# Patient Record
Sex: Female | Born: 1940 | Race: White | Hispanic: No | Smoking: Current every day smoker
Health system: Southern US, Community
[De-identification: ages and names within clinical notes are randomized; demographics above are authoritative.]

## PROBLEM LIST (undated history)

## (undated) DIAGNOSIS — D0510 Intraductal carcinoma in situ of unspecified breast: Secondary | ICD-10-CM

## (undated) DIAGNOSIS — I10 Essential (primary) hypertension: Secondary | ICD-10-CM

## (undated) DIAGNOSIS — M810 Age-related osteoporosis without current pathological fracture: Secondary | ICD-10-CM

## (undated) DIAGNOSIS — M1990 Unspecified osteoarthritis, unspecified site: Secondary | ICD-10-CM

## (undated) DIAGNOSIS — M419 Scoliosis, unspecified: Secondary | ICD-10-CM

## (undated) DIAGNOSIS — R42 Dizziness and giddiness: Secondary | ICD-10-CM

## (undated) HISTORY — DX: Age-related osteoporosis without current pathological fracture: M81.0

## (undated) HISTORY — DX: Intraductal carcinoma in situ of unspecified breast: D05.10

## (undated) HISTORY — DX: Scoliosis, unspecified: M41.9

## (undated) HISTORY — PX: OTHER SURGICAL HISTORY: SHX169

## (undated) HISTORY — PX: THYROIDECTOMY: SHX17

---

## 2004-02-25 ENCOUNTER — Ambulatory Visit (HOSPITAL_COMMUNITY): Admission: RE | Admit: 2004-02-25 | Discharge: 2004-02-25 | Payer: Self-pay | Admitting: Family Medicine

## 2009-08-20 ENCOUNTER — Emergency Department (HOSPITAL_COMMUNITY): Admission: EM | Admit: 2009-08-20 | Discharge: 2009-08-20 | Payer: Self-pay | Admitting: Emergency Medicine

## 2014-02-14 ENCOUNTER — Emergency Department (HOSPITAL_COMMUNITY)
Admission: EM | Admit: 2014-02-14 | Discharge: 2014-02-14 | Disposition: A | Discharge status: Home / Self Care | Payer: Medicare Other | Attending: Emergency Medicine | Admitting: Emergency Medicine

## 2014-02-14 ENCOUNTER — Emergency Department (HOSPITAL_COMMUNITY): Payer: Medicare Other

## 2014-02-14 ENCOUNTER — Encounter (HOSPITAL_COMMUNITY): Payer: Self-pay | Admitting: Emergency Medicine

## 2014-02-14 DIAGNOSIS — M25569 Pain in unspecified knee: Secondary | ICD-10-CM | POA: Insufficient documentation

## 2014-02-14 DIAGNOSIS — I10 Essential (primary) hypertension: Secondary | ICD-10-CM | POA: Insufficient documentation

## 2014-02-14 DIAGNOSIS — M545 Low back pain, unspecified: Secondary | ICD-10-CM | POA: Insufficient documentation

## 2014-02-14 HISTORY — DX: Unspecified osteoarthritis, unspecified site: M19.90

## 2014-02-14 LAB — URINALYSIS, ROUTINE W REFLEX MICROSCOPIC
Bilirubin Urine: NEGATIVE
Glucose, UA: NEGATIVE mg/dL
LEUKOCYTES UA: NEGATIVE
NITRITE: NEGATIVE
PROTEIN: 30 mg/dL — AB
Urobilinogen, UA: 0.2 mg/dL (ref 0.0–1.0)
pH: 5.5 (ref 5.0–8.0)

## 2014-02-14 LAB — URINE MICROSCOPIC-ADD ON

## 2014-02-14 MED ORDER — OXYCODONE-ACETAMINOPHEN 5-325 MG PO TABS: 1.0000 | ORAL_TABLET | Freq: Four times a day (QID) | ORAL | Status: DC | PRN

## 2014-02-14 MED ORDER — OXYCODONE-ACETAMINOPHEN 5-325 MG PO TABS
1.0000 | ORAL_TABLET | Freq: Once | ORAL | Status: AC
  Administered 2014-02-14: 1 via ORAL
  Filled 2014-02-14: qty 1

## 2014-02-14 MED ORDER — CYCLOBENZAPRINE HCL 10 MG PO TABS: 10.0000 mg | ORAL_TABLET | Freq: Two times a day (BID) | ORAL | Status: DC | PRN

## 2014-02-14 NOTE — Discharge Instructions (Signed)
SEEK IMMEDIATE MEDICAL ATTENTION IF: New numbness, tingling, weakness, or problem with the use of your arms or legs.  Severe back pain not relieved with medications.  Change in bowel or bladder control (if you lose control of stool or urine, or if you are unable to urinate) Increasing pain in any areas of the body (such as chest or abdominal pain).  Shortness of breath, dizziness or fainting.  Nausea (feeling sick to your stomach), vomiting, fever, or sweats.    Hypertension Hypertension, commonly called high blood pressure, is when the force of blood pumping through your arteries is too strong. Your arteries are the blood vessels that carry blood from your heart throughout your body. A blood pressure reading consists of a higher number over a lower number, such as 110/72. The higher number (systolic) is the pressure inside your arteries when your heart pumps. The lower number (diastolic) is the pressure inside your arteries when your heart relaxes. Ideally you want your blood pressure below 120/80. Hypertension forces your heart to work harder to pump blood. Your arteries may become narrow or stiff. Having hypertension puts you at risk for heart disease, stroke, and other problems.  RISK FACTORS Some risk factors for high blood pressure are controllable. Others are not.  Risk factors you cannot control include:   Race. You may be at higher risk if you are African American.  Age. Risk increases with age.  Gender. Men are at higher risk than women before age 52 years. After age 37, women are at higher risk than men. Risk factors you can control include:  Not getting enough exercise or physical activity.  Being overweight.  Getting too much fat, sugar, calories, or salt in your diet.  Drinking too much alcohol. SIGNS AND SYMPTOMS Hypertension does not usually cause signs or symptoms. Extremely high blood pressure (hypertensive crisis) may cause headache, anxiety, shortness of breath,  and nosebleed. DIAGNOSIS  To check if you have hypertension, your health care provider will measure your blood pressure while you are seated, with your arm held at the level of your heart. It should be measured at least twice using the same arm. Certain conditions can cause a difference in blood pressure between your right and left arms. A blood pressure reading that is higher than normal on one occasion does not mean that you need treatment. If one blood pressure reading is high, ask your health care provider about having it checked again. TREATMENT  Treating high blood pressure includes making lifestyle changes and possibly taking medication. Living a healthy lifestyle can help lower high blood pressure. You may need to change some of your habits. Lifestyle changes may include:  Following the DASH diet. This diet is high in fruits, vegetables, and whole grains. It is low in salt, red meat, and added sugars.  Getting at least 2 1/2 hours of brisk physical activity every week.  Losing weight if necessary.  Not smoking.  Limiting alcoholic beverages.  Learning ways to reduce stress. If lifestyle changes are not enough to get your blood pressure under control, your health care provider may prescribe medicine. You may need to take more than one. Work closely with your health care provider to understand the risks and benefits. HOME CARE INSTRUCTIONS  Have your blood pressure rechecked as directed by your health care provider.   Only take medicine as directed by your health care provider. Follow the directions carefully. Blood pressure medicines must be taken as prescribed. The medicine does not work as  well when you skip doses. Skipping doses also puts you at risk for problems.   Do not smoke.   Monitor your blood pressure at home as directed by your health care provider. SEEK MEDICAL CARE IF:   You think you are having a reaction to medicines taken.  You have recurrent headaches or  feel dizzy.  You have swelling in your ankles.  You have trouble with your vision. SEEK IMMEDIATE MEDICAL CARE IF:  You develop a severe headache or confusion.  You have unusual weakness, numbness, or feel faint.  You have severe chest or abdominal pain.  You vomit repeatedly.  You have trouble breathing. MAKE SURE YOU:   Understand these instructions.  Will watch your condition.  Will get help right away if you are not doing well or get worse. Document Released: 08/02/2005 Document Revised: 08/07/2013 Document Reviewed: 05/25/2013 [facility] Patient Information 2015 [place], [place]. This information is not intended to replace advice given to you by your health care provider. Make sure you discuss any questions you have with your health care provider.

## 2014-02-14 NOTE — ED Notes (Signed)
Patient with no complaints at this time. Respirations even and unlabored. Skin warm/dry. Discharge instructions reviewed with patient at this time. Patient given opportunity to voice concerns/ask questions. Patient discharged at this time and left Emergency Department with steady gait.   

## 2014-02-14 NOTE — ED Notes (Signed)
MD in room talking with daughter at this time.

## 2014-02-14 NOTE — ED Notes (Signed)
Patient c/o lower back pain and right knee pain. Per patient picked up "something heavy" Monday and back and knee pain started Tuesday. Denies any other injury or difficulty with urination or BMs.

## 2014-02-14 NOTE — ED Provider Notes (Signed)
CSN: [id number]     Arrival date & time 02/14/14  3875 History  This chart was scribed for Joya Gaskins, MD by Leone Payor, ED Scribe. This patient was seen in room APA03/APA03 and the patient's care was started 7:42 AM.    Chief Complaint  Patient presents with  . Back Pain  . Knee Pain    Patient is a 73 y.o. female presenting with back pain and knee pain. The history is provided by the patient. No language interpreter was used.  Back Pain Location:  Lumbar spine Radiates to:  Does not radiate Pain severity:  Mild Pain is:  Same all the time Onset quality:  Gradual Duration:  2 days Timing:  Constant Progression:  Worsening Chronicity:  New Context: lifting heavy objects   Context: not falling   Worsened by:  Ambulation Ineffective treatments:  NSAIDs Associated symptoms: no abdominal pain, no bladder incontinence, no bowel incontinence, no dysuria, no fever, no leg pain, no numbness, no paresthesias, no tingling and no weakness   Risk factors: no recent surgery   Knee Pain Associated symptoms: back pain   Associated symptoms: no fever     HPI Comments: Maria Hardy is a 73 y.o. female who presents to the Emergency Department complaining of gradual onset, constant, gradually worsened, non-radiating lower back pain that began 2 days ago. Patient states she lifted a heavy object 3 days ago but states the pain did not begin until the following day. She also reports right knee pain but she believes this to be related to arthritis. She denies any recent falls or trauma. She has taken ibuprofen without relief. She denies fever, vomiting, chest pain, abdominal pain, dysuria, lower extremity weakness, change in bowel of bladder function. She denies history of back surgeries.   PMH - none No past surgical history on file. No family history on file. History  Substance Use Topics  . Smoking status: Not on file  . Smokeless tobacco: Not on file  . Alcohol Use: Not on file   OB  History   No data available     Review of Systems  Constitutional: Negative for fever.  Gastrointestinal: Negative for vomiting, abdominal pain and bowel incontinence.  Genitourinary: Negative for bladder incontinence and dysuria.  Musculoskeletal: Positive for arthralgias (right knee pain) and back pain.  Neurological: Negative for tingling, weakness, numbness and paresthesias.  All other systems reviewed and are negative.     Allergies  Review of patient's allergies indicates not on file.  Home Medications   Prior to Admission medications   Not on File   BP 189/102  Pulse 70  Temp(Src) 98.5 F (36.9 C) (Oral)  Resp 18  Ht 5\' 7"  (1.702 m)  Wt 170 lb (77.111 kg)  BMI 26.62 kg/m2  SpO2 98% Physical Exam  Nursing note and vitals reviewed.   CONSTITUTIONAL: Well developed/well nourished HEAD: Normocephalic/atraumatic EYES: EOMI ENMT: Mucous membranes moist NECK: supple no meningeal signs SPINE:entire spine nontender lumbar paraspinal tenderness, No bruising/crepitance/stepoffs noted to spine CV: S1/S2 noted, no murmurs/rubs/gallops noted LUNGS: Lungs are clear to auscultation bilaterally, no apparent distress ABDOMEN: soft, nontender, no rebound or guarding GU:no cva tenderness NEURO: Awake/alert, equal motor 5/5 strength noted with the following: hip flexion/knee flexion/extension, foot dorsi/plantar flexion, great toe extension intact bilaterally, no clonus bilaterally, plantar reflex appropriate (toes downgoing), no sensory deficit in any dermatome.  Equal patellar/achilles reflex noted (2+) in bilateral lower extremities.  Pt is able to ambulate unassisted. EXTREMITIES: pulses normal, full  ROM, mild tenderness to medial aspect of right knee, no bruising or deformity  SKIN: warm, color normal PSYCH: no abnormalities of mood noted   ED Course  Procedures   DIAGNOSTIC STUDIES: Oxygen Saturation is 98% on RA, normal by my interpretation.    COORDINATION OF  CARE: 7:46 AM Will order XRAY of lumbar spine and UA. Discussed treatment plan with pt at bedside and pt agreed to plan.  8:37 AM Daughter in law tells me in private that since the loss of her husband in the spring, patient cries at night and does not sleep.  She feels she needs "something for sleep" She is not a safety threat and does not feel she will harm herself.  I advised f/u with her PCP for further management  Pt improved Advised PCP followup Stable for d/c home   Labs Review Labs Reviewed  URINALYSIS, ROUTINE W REFLEX MICROSCOPIC - Abnormal; Notable for the following:    Specific Gravity, Urine >1.030 (*)    Hgb urine dipstick TRACE (*)    Ketones, ur TRACE (*)    Protein, ur 30 (*)    All other components within normal limits  URINE MICROSCOPIC-ADD ON - Abnormal; Notable for the following:    Squamous Epithelial / LPF MANY (*)    Bacteria, UA MANY (*)    All other components within normal limits  URINE CULTURE    Imaging Review Dg Lumbar Spine Complete  02/14/2014   CLINICAL DATA:  Low back pain.  Lifting injury on Monday.  EXAM: LUMBAR SPINE - COMPLETE 4+ VIEW  COMPARISON:  None.  FINDINGS: There is no evidence of lumbar spine fracture. There is grade 1 anterior listhesis of L5 on S1, degenerative. Minimal anterior osteophytosis distended throughout lumbar spine.  IMPRESSION: No acute fracture or dislocation. Degenerative joint changes of lumbar spine.   Electronically Signed   By: Sherian ReinWei-Chen  Lin M.D.   On: 02/14/2014 08:20      MDM   Final diagnoses:  Low back pain without sciatica, unspecified back pain laterality  Essential hypertension   Nursing notes including past medical history and social history reviewed and considered in documentation xrays reviewed and considered   I personally performed the services described in this documentation, which was scribed in my presence. The recorded information has been reviewed and is accurate.      Joya Gaskinsonald W Rajah Tagliaferro,  MD 02/14/14 [phone]

## 2014-02-15 LAB — URINE CULTURE: Colony Count: 30000

## 2014-06-17 ENCOUNTER — Encounter (HOSPITAL_COMMUNITY): Payer: Self-pay | Admitting: Emergency Medicine

## 2014-12-27 ENCOUNTER — Emergency Department (HOSPITAL_COMMUNITY): Payer: Medicare HMO

## 2014-12-27 ENCOUNTER — Encounter (HOSPITAL_COMMUNITY): Payer: Self-pay | Admitting: Emergency Medicine

## 2014-12-27 ENCOUNTER — Emergency Department (HOSPITAL_COMMUNITY)
Admission: EM | Admit: 2014-12-27 | Discharge: 2014-12-27 | Disposition: A | Discharge status: Home / Self Care | Payer: Medicare HMO | Attending: Emergency Medicine | Admitting: Emergency Medicine

## 2014-12-27 DIAGNOSIS — R42 Dizziness and giddiness: Secondary | ICD-10-CM

## 2014-12-27 DIAGNOSIS — Z720 Tobacco use: Secondary | ICD-10-CM | POA: Diagnosis not present

## 2014-12-27 DIAGNOSIS — M1990 Unspecified osteoarthritis, unspecified site: Secondary | ICD-10-CM | POA: Diagnosis not present

## 2014-12-27 DIAGNOSIS — I10 Essential (primary) hypertension: Secondary | ICD-10-CM | POA: Diagnosis not present

## 2014-12-27 DIAGNOSIS — R112 Nausea with vomiting, unspecified: Secondary | ICD-10-CM | POA: Insufficient documentation

## 2014-12-27 LAB — I-STAT CHEM 8, ED
BUN: 21 mg/dL — AB (ref 6–20)
CHLORIDE: 104 mmol/L (ref 101–111)
Calcium, Ion: 1.18 mmol/L (ref 1.13–1.30)
Creatinine, Ser: 0.7 mg/dL (ref 0.44–1.00)
GLUCOSE: 106 mg/dL — AB (ref 65–99)
HCT: 47 % — ABNORMAL HIGH (ref 36.0–46.0)
Hemoglobin: 16 g/dL — ABNORMAL HIGH (ref 12.0–15.0)
POTASSIUM: 4 mmol/L (ref 3.5–5.1)
SODIUM: 141 mmol/L (ref 135–145)
TCO2: 22 mmol/L (ref 0–100)

## 2014-12-27 LAB — CBC WITH DIFFERENTIAL/PLATELET
BASOS ABS: 0 10*3/uL (ref 0.0–0.1)
BASOS PCT: 0 % (ref 0–1)
Eosinophils Absolute: 0 10*3/uL (ref 0.0–0.7)
Eosinophils Relative: 1 % (ref 0–5)
HEMATOCRIT: 45.6 % (ref 36.0–46.0)
HEMOGLOBIN: 15.4 g/dL — AB (ref 12.0–15.0)
LYMPHS PCT: 20 % (ref 12–46)
Lymphs Abs: 1.7 10*3/uL (ref 0.7–4.0)
MCH: 31.2 pg (ref 26.0–34.0)
MCHC: 33.8 g/dL (ref 30.0–36.0)
MCV: 92.5 fL (ref 78.0–100.0)
MONO ABS: 0.4 10*3/uL (ref 0.1–1.0)
MONOS PCT: 4 % (ref 3–12)
NEUTROS ABS: 6.5 10*3/uL (ref 1.7–7.7)
NEUTROS PCT: 75 % (ref 43–77)
Platelets: 265 10*3/uL (ref 150–400)
RBC: 4.93 MIL/uL (ref 3.87–5.11)
RDW: 12.4 % (ref 11.5–15.5)
WBC: 8.6 10*3/uL (ref 4.0–10.5)

## 2014-12-27 LAB — COMPREHENSIVE METABOLIC PANEL
ALT: 12 U/L — ABNORMAL LOW (ref 14–54)
ANION GAP: 9 (ref 5–15)
AST: 20 U/L (ref 15–41)
Albumin: 4.6 g/dL (ref 3.5–5.0)
Alkaline Phosphatase: 93 U/L (ref 38–126)
BILIRUBIN TOTAL: 0.9 mg/dL (ref 0.3–1.2)
BUN: 21 mg/dL — AB (ref 6–20)
CALCIUM: 9.2 mg/dL (ref 8.9–10.3)
CHLORIDE: 104 mmol/L (ref 101–111)
CO2: 25 mmol/L (ref 22–32)
Creatinine, Ser: 0.72 mg/dL (ref 0.44–1.00)
GFR calc Af Amer: >60 mL/min (ref >60)
GFR calc non Af Amer: >60 mL/min (ref >60)
GLUCOSE: 108 mg/dL — AB (ref 65–99)
Potassium: 3.9 mmol/L (ref 3.5–5.1)
Sodium: 138 mmol/L (ref 135–145)
TOTAL PROTEIN: 7.7 g/dL (ref 6.5–8.1)

## 2014-12-27 LAB — URINALYSIS, ROUTINE W REFLEX MICROSCOPIC
Bilirubin Urine: NEGATIVE
Glucose, UA: NEGATIVE mg/dL
KETONES UR: 15 mg/dL — AB
NITRITE: NEGATIVE
PROTEIN: NEGATIVE mg/dL
Specific Gravity, Urine: 1.025 (ref 1.005–1.030)
UROBILINOGEN UA: 0.2 mg/dL (ref 0.0–1.0)
pH: 6 (ref 5.0–8.0)

## 2014-12-27 LAB — RAPID URINE DRUG SCREEN, HOSP PERFORMED
Amphetamines: NOT DETECTED
BARBITURATES: NOT DETECTED
BENZODIAZEPINES: NOT DETECTED
COCAINE: NOT DETECTED
OPIATES: NOT DETECTED
TETRAHYDROCANNABINOL: NOT DETECTED

## 2014-12-27 LAB — APTT: aPTT: 35 seconds (ref 24–37)

## 2014-12-27 LAB — URINE MICROSCOPIC-ADD ON

## 2014-12-27 LAB — I-STAT TROPONIN, ED: TROPONIN I, POC: 0 ng/mL (ref 0.00–0.08)

## 2014-12-27 LAB — PROTIME-INR
INR: 0.99 (ref 0.00–1.49)
PROTHROMBIN TIME: 13.2 s (ref 11.6–15.2)

## 2014-12-27 LAB — ETHANOL: Alcohol, Ethyl (B): 5 mg/dL — ABNORMAL HIGH (ref <5)

## 2014-12-27 MED ORDER — MECLIZINE HCL 12.5 MG PO TABS
25.0000 mg | ORAL_TABLET | Freq: Once | ORAL | Status: AC
  Administered 2014-12-27: 25 mg via ORAL
  Filled 2014-12-27: qty 2

## 2014-12-27 MED ORDER — MECLIZINE HCL 25 MG PO TABS: 25.0000 mg | ORAL_TABLET | Freq: Four times a day (QID) | ORAL | Status: DC

## 2014-12-27 MED ORDER — ONDANSETRON HCL 4 MG/2ML IJ SOLN
4.0000 mg | Freq: Once | INTRAMUSCULAR | Status: AC
  Administered 2014-12-27: 4 mg via INTRAVENOUS
  Filled 2014-12-27: qty 2

## 2014-12-27 MED ORDER — ONDANSETRON 4 MG PO TBDP: 4.0000 mg | ORAL_TABLET | Freq: Three times a day (TID) | ORAL | Status: AC | PRN

## 2014-12-27 NOTE — ED Notes (Signed)
Pt c/o dizziness that started this am. Pt states she got dizzy and vomited x 1. Pt denies CP or SOB. No neuro deficits noted. Pt states she is okay as long as she is laying down but gets dizzy when she stands.

## 2014-12-27 NOTE — ED Notes (Signed)
Pt states she is having dizziness with x1 episode of emesis.    Pt is alert and oriented at this time with no difficulties breathing.

## 2014-12-27 NOTE — Discharge Instructions (Signed)
Benign Positional Vertigo Vertigo means you feel like you or your surroundings are moving when they are not. Benign positional vertigo is the most common form of vertigo. Benign means that the cause of your condition is not serious. Benign positional vertigo is more common in older adults. CAUSES  Benign positional vertigo is the result of an upset in the labyrinth system. This is an area in the middle ear that helps control your balance. This may be caused by a viral infection, head injury, or repetitive motion. However, often no specific cause is found. SYMPTOMS  Symptoms of benign positional vertigo occur when you move your head or eyes in different directions. Some of the symptoms may include:  Loss of balance and falls.  Vomiting.  Blurred vision.  Dizziness.  Nausea.  Involuntary eye movements (nystagmus). DIAGNOSIS  Benign positional vertigo is usually diagnosed by physical exam. If the specific cause of your benign positional vertigo is unknown, your caregiver may perform imaging tests, such as magnetic resonance imaging (MRI) or computed tomography (CT). TREATMENT  Your caregiver may recommend movements or procedures to correct the benign positional vertigo. Medicines such as meclizine, benzodiazepines, and medicines for nausea may be used to treat your symptoms. In rare cases, if your symptoms are caused by certain conditions that affect the inner ear, you may need surgery. HOME CARE INSTRUCTIONS   Follow your caregiver's instructions.  Move slowly. Do not make sudden body or head movements.  Avoid driving.  Avoid operating heavy machinery.  Avoid performing any tasks that would be dangerous to you or others during a vertigo episode.  Drink enough fluids to keep your urine clear or pale yellow. SEEK IMMEDIATE MEDICAL CARE IF:   You develop problems with walking, weakness, numbness, or using your arms, hands, or legs.  You have difficulty speaking.  You develop  severe headaches.  Your nausea or vomiting continues or gets worse.  You develop visual changes.  Your family or friends notice any behavioral changes.  Your condition gets worse.  You have a fever.  You develop a stiff neck or sensitivity to light. MAKE SURE YOU:   Understand these instructions.  Will watch your condition.  Will get help right away if you are not doing well or get worse. Document Released: 05/10/2006 Document Revised: 10/25/2011 Document Reviewed: 04/22/2011 [facility] Patient Information 2015 [place], [place]. This information is not intended to replace advice given to you by your health care provider. Make sure you discuss any questions you have with your health care provider.  MRI of the brain without any acute findings. Take and I burred as directed. Take Zofran as needed for nausea and vomiting. Make an appointment to follow-up with your doctor next week. For recheck for the vertigo and also for recheck of your blood pressure.

## 2014-12-27 NOTE — ED Provider Notes (Signed)
CSN: [id number]     Arrival date & time 12/27/14  1034 History   First MD Initiated Contact with Patient 12/27/14 1047     Chief Complaint  Patient presents with  . Dizziness   Patient is a 74 y.o. female presenting with dizziness. The history is provided by the patient. No language interpreter was used.  Dizziness Associated symptoms: nausea and vomiting   Associated symptoms: no chest pain, no headaches, no shortness of breath and no weakness    This chart was scribed for Vanetta Mulders, MD by Andrew Au, ED Scribe. This patient was seen in room APA18/APA18 and the patient's care was started at 2:36 PM.  HPI Comments:  Maria Hardy is a 74 y.o. female who present to the Emergency Department complaining of dizziness onset 3 hours ago. Pt reports room spinning dizziness this morning when trying to get out of bed. She reports associated nausea and emeis this morning with dizziness and some back pain.  She denies being dizzy at this time while lying in bed. She denies hx dizziness. She denies visual changes, changes in speech, fever,chills, CP, SOB, abdominal pain, leg swelling, rash, headache, weakness, numbness, and dysuria.  Past Medical History  Diagnosis Date  . Arthritis    Past Surgical History  Procedure Laterality Date  . Thyroidectomy     History reviewed. No pertinent family history. History  Substance Use Topics  . 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 Smokeless tobacco: Never Used  . Alcohol Use: No   OB History    Gravida Para Term Preterm AB TAB SAB Ectopic Multiple Living   3 3 3       3      Review of Systems  Constitutional: Negative for fever.  HENT: Negative for congestion.   Eyes: Negative for visual disturbance.  Respiratory: Negative for shortness of breath.   Cardiovascular: Negative for chest pain and leg swelling.  Gastrointestinal: Positive for nausea and vomiting.  Genitourinary:  Negative for dysuria.  Musculoskeletal: Negative for back pain.  Skin: Negative for rash.  Neurological: Positive for dizziness. Negative for speech difficulty, weakness, numbness and headaches.  Psychiatric/Behavioral: Negative for confusion.   Allergies  Review of patient's allergies indicates no known allergies.  Home Medications   Prior to Admission medications   Medication Sig Start Date End Date Taking? Authorizing Provider  ibuprofen (ADVIL,MOTRIN) 200 MG tablet Take 800 mg by mouth every 6 (six) hours as needed for moderate pain.   Yes Historical Provider, MD  cyclobenzaprine (FLEXERIL) 10 MG tablet Take 1 tablet (10 mg total) by mouth 2 (two) times daily as needed for muscle spasms. Patient not taking: Reported on 12/27/2014 02/14/14   Zadie Rhine, MD  meclizine (ANTIVERT) 25 MG tablet Take 1 tablet (25 mg total) by mouth 4 (four) times daily. 12/27/14   Vanetta Mulders, MD  ondansetron (ZOFRAN ODT) 4 MG disintegrating tablet Take 1 tablet (4 mg total) by mouth every 8 (eight) hours as needed. 12/27/14   Vanetta Mulders, MD  oxyCODONE-acetaminophen (PERCOCET/ROXICET) 5-325 MG per tablet Take 1 tablet by mouth every 6 (six) hours as needed for severe pain. Patient not taking: Reported on 12/27/2014 02/14/14   Zadie Rhine, MD   BP 187/92 mmHg  Pulse 62  Temp(Src) 97.8 F (36.6 C) (Oral)  Resp 14  Ht 5' 6.5" (1.689 m)  Wt 160 lb (72.576 kg)  BMI 25.44 kg/m2  SpO2 92% Physical  Exam  Constitutional: She is oriented to person, place, and time. She appears well-developed and well-nourished. No distress.  HENT:  Head: Normocephalic and atraumatic.  Mouth/Throat: Oropharynx is clear and moist.  Eyes: Conjunctivae and EOM are normal. Pupils are equal, round, and reactive to light. No scleral icterus.  Neck: Neck supple.  Cardiovascular: Normal rate and regular rhythm.   Pulmonary/Chest: Effort normal.  Abdominal: Bowel sounds are normal. There is no tenderness.   Musculoskeletal: Normal range of motion.  No swelling in ankles.  Neurological: She is alert and oriented to person, place, and time.  Skin: Skin is warm and dry.  Psychiatric: She has a normal mood and affect. Her behavior is normal.  Nursing note and vitals reviewed.   ED Course  Procedures (including critical care time) Labs Review Labs Reviewed  CBC WITH DIFFERENTIAL/PLATELET - Abnormal; Notable for the following:    Hemoglobin 15.4 (*)    All other components within normal limits  COMPREHENSIVE METABOLIC PANEL - Abnormal; Notable for the following:    Glucose, Bld 108 (*)    BUN 21 (*)    ALT 12 (*)    All other components within normal limits  ETHANOL - Abnormal; Notable for the following:    Alcohol, Ethyl (B) 5 (*)    All other components within normal limits  URINALYSIS, ROUTINE W REFLEX MICROSCOPIC - Abnormal; Notable for the following:    APPearance HAZY (*)    Hgb urine dipstick SMALL (*)    Ketones, ur 15 (*)    Leukocytes, UA TRACE (*)    All other components within normal limits  URINE MICROSCOPIC-ADD ON - Abnormal; Notable for the following:    Squamous Epithelial / LPF MANY (*)    Bacteria, UA MANY (*)    All other components within normal limits  I-STAT CHEM 8, ED - Abnormal; Notable for the following:    BUN 21 (*)    Glucose, Bld 106 (*)    Hemoglobin 16.0 (*)    HCT 47.0 (*)    All other components within normal limits  PROTIME-INR  APTT  URINE RAPID DRUG SCREEN (HOSP PERFORMED)  I-STAT TROPOININ, ED    Imaging Review Ct Head Wo Contrast  12/27/2014   CLINICAL DATA:  Dizziness with 1 episode of emesis beginning earlier today  EXAM: CT HEAD WITHOUT CONTRAST  TECHNIQUE: Contiguous axial images were obtained from the base of the skull through the vertex without intravenous contrast.  COMPARISON:  None.  FINDINGS: The ventricles are normal in size and configuration. There is no intracranial mass, hemorrhage, extra-axial fluid collection, or midline  shift. There is slight small vessel disease in the centra semiovale bilaterally. Elsewhere gray-white compartments are normal. No acute infarct apparent. The bony calvarium appears intact. The mastoid air cells are clear. There is opacification of multiple ethmoid air cells on the right.  IMPRESSION: Slight periventricular small vessel disease. No intracranial mass, hemorrhage, or acute appearing infarct. Right-sided ethmoid sinus disease.   Electronically Signed   By: Bretta Bang III M.D.   On: 12/27/2014 12:37   Mr Brain Wo Contrast  12/27/2014   CLINICAL DATA:  Episode of dizziness today lasting 3 hours.  EXAM: MRI HEAD WITHOUT CONTRAST  TECHNIQUE: Multiplanar, multiecho pulse sequences of the brain and surrounding structures were obtained without intravenous contrast.  COMPARISON:  Head CT earlier today  FINDINGS: There is no evidence of acute infarct, intracranial hemorrhage, mass, midline shift, or extra-axial fluid collection. Ventricles and sulci are within  normal limits for age. No significant white matter disease is identified for patient's age.  Orbits are unremarkable. There is moderate right ethmoid air cell mucosal thickening. Mastoid air cells are clear. Major intracranial vascular flow voids are preserved.  IMPRESSION: Unremarkable appearance of the brain for age.   Electronically Signed   By: Sebastian Ache   On: 12/27/2014 13:53     EKG Interpretation   Date/Time:  Friday Dec 27 2014 10:46:13 EDT Ventricular Rate:  66 PR Interval:  157 QRS Duration: 108 QT Interval:  427 QTC Calculation: 447 R Axis:   114 Text Interpretation:  Sinus or ectopic atrial rhythm Right axis deviation  No previous ECGs available Confirmed by Thamas Appleyard  MD, Nikolay Demetriou ([id number]) on  12/27/2014 10:50:08 AM      MDM   Final diagnoses:  Vertigo  Essential hypertension    Patient with acute onset of vertigo. Also noted to have high blood pressure. Patient without a history of high blood pressure in the  past. Head CT was negative so MRI of the brain was done nothing remarkable there. Patient's blood pressure waxed and waned some given Antivert. Best blood pressure here was 165/96. Patient will need close follow-up for monitoring of his blood pressure. Patient will be sent home with Antivert and antinausea medicine. Patient has primary care doctor to follow-up with.  Symptoms most likely consistent with positional vertigo quickly in light of the negative MRI. No evidence of stroke. No other focal findings patient nontoxic no acute distress.  I personally performed the services described in this documentation, which was scribed in my presence. The recorded information has been reviewed and is accurate.     Vanetta Mulders, MD 12/27/14 1438

## 2014-12-27 NOTE — ED Notes (Signed)
Pt being taken off monitor and going to MRI.

## 2014-12-27 NOTE — ED Notes (Addendum)
MD made aware of increasing blood pressure. MD at bedside currently.  Pt is alert and oriented with no change in status.

## 2014-12-27 NOTE — ED Notes (Signed)
Pt ambulated to the bathroom with no assist needed. Pt was balanced and showed no difficulties while walking.  Pt states that she is no longer feeling dizzy.

## 2014-12-27 NOTE — ED Notes (Signed)
MD at bedside. 

## 2014-12-27 NOTE — ED Notes (Signed)
Requested pt urine. Pt unable to give specimen at this time.

## 2015-01-10 ENCOUNTER — Other Ambulatory Visit (HOSPITAL_COMMUNITY): Payer: Self-pay | Admitting: Family Medicine

## 2015-01-10 DIAGNOSIS — Z1231 Encounter for screening mammogram for malignant neoplasm of breast: Secondary | ICD-10-CM

## 2015-01-29 ENCOUNTER — Ambulatory Visit (HOSPITAL_COMMUNITY)
Admission: RE | Admit: 2015-01-29 | Discharge: 2015-01-29 | Disposition: A | Discharge status: Home / Self Care | Payer: Medicare HMO | Source: Ambulatory Visit | Attending: Family Medicine | Admitting: Family Medicine

## 2015-01-29 ENCOUNTER — Other Ambulatory Visit (HOSPITAL_COMMUNITY): Payer: Self-pay | Admitting: Family Medicine

## 2015-01-29 DIAGNOSIS — Z720 Tobacco use: Secondary | ICD-10-CM | POA: Diagnosis not present

## 2015-01-29 DIAGNOSIS — Z87891 Personal history of nicotine dependence: Secondary | ICD-10-CM

## 2015-01-29 DIAGNOSIS — Z1231 Encounter for screening mammogram for malignant neoplasm of breast: Secondary | ICD-10-CM

## 2015-01-29 DIAGNOSIS — J449 Chronic obstructive pulmonary disease, unspecified: Secondary | ICD-10-CM | POA: Insufficient documentation

## 2015-05-05 ENCOUNTER — Ambulatory Visit (HOSPITAL_COMMUNITY)
Admission: RE | Admit: 2015-05-05 | Discharge: 2015-05-05 | Disposition: A | Discharge status: Home / Self Care | Payer: Medicare HMO | Source: Ambulatory Visit | Attending: Family Medicine | Admitting: Family Medicine

## 2015-05-05 ENCOUNTER — Other Ambulatory Visit (HOSPITAL_COMMUNITY): Payer: Self-pay | Admitting: Family Medicine

## 2015-05-05 DIAGNOSIS — M25561 Pain in right knee: Secondary | ICD-10-CM | POA: Insufficient documentation

## 2015-05-05 DIAGNOSIS — G8929 Other chronic pain: Secondary | ICD-10-CM | POA: Insufficient documentation

## 2016-01-06 DIAGNOSIS — M159 Polyosteoarthritis, unspecified: Secondary | ICD-10-CM | POA: Diagnosis not present

## 2016-01-06 DIAGNOSIS — M1990 Unspecified osteoarthritis, unspecified site: Secondary | ICD-10-CM | POA: Diagnosis not present

## 2016-01-06 DIAGNOSIS — M25569 Pain in unspecified knee: Secondary | ICD-10-CM | POA: Diagnosis not present

## 2016-01-06 DIAGNOSIS — I10 Essential (primary) hypertension: Secondary | ICD-10-CM | POA: Diagnosis not present

## 2016-01-06 DIAGNOSIS — F329 Major depressive disorder, single episode, unspecified: Secondary | ICD-10-CM | POA: Diagnosis not present

## 2016-01-06 DIAGNOSIS — G3184 Mild cognitive impairment, so stated: Secondary | ICD-10-CM | POA: Diagnosis not present

## 2016-01-07 ENCOUNTER — Other Ambulatory Visit (HOSPITAL_COMMUNITY): Payer: Self-pay | Admitting: Internal Medicine

## 2016-01-07 DIAGNOSIS — M1990 Unspecified osteoarthritis, unspecified site: Secondary | ICD-10-CM

## 2016-01-07 DIAGNOSIS — Z780 Asymptomatic menopausal state: Secondary | ICD-10-CM

## 2016-01-15 ENCOUNTER — Ambulatory Visit (HOSPITAL_COMMUNITY)
Admission: RE | Admit: 2016-01-15 | Discharge: 2016-01-15 | Disposition: A | Discharge status: Home / Self Care | Payer: Medicare HMO | Source: Ambulatory Visit | Attending: Internal Medicine | Admitting: Internal Medicine

## 2016-01-15 DIAGNOSIS — M8588 Other specified disorders of bone density and structure, other site: Secondary | ICD-10-CM | POA: Diagnosis not present

## 2016-01-15 DIAGNOSIS — M810 Age-related osteoporosis without current pathological fracture: Secondary | ICD-10-CM | POA: Diagnosis not present

## 2016-01-15 DIAGNOSIS — Z780 Asymptomatic menopausal state: Secondary | ICD-10-CM | POA: Diagnosis not present

## 2016-01-15 DIAGNOSIS — M1990 Unspecified osteoarthritis, unspecified site: Secondary | ICD-10-CM

## 2016-02-03 DIAGNOSIS — M25569 Pain in unspecified knee: Secondary | ICD-10-CM | POA: Diagnosis not present

## 2016-02-03 DIAGNOSIS — M1990 Unspecified osteoarthritis, unspecified site: Secondary | ICD-10-CM | POA: Diagnosis not present

## 2016-02-03 DIAGNOSIS — H903 Sensorineural hearing loss, bilateral: Secondary | ICD-10-CM | POA: Diagnosis not present

## 2016-02-03 DIAGNOSIS — R011 Cardiac murmur, unspecified: Secondary | ICD-10-CM | POA: Diagnosis not present

## 2016-02-03 DIAGNOSIS — M810 Age-related osteoporosis without current pathological fracture: Secondary | ICD-10-CM | POA: Diagnosis not present

## 2016-02-03 DIAGNOSIS — I10 Essential (primary) hypertension: Secondary | ICD-10-CM | POA: Diagnosis not present

## 2016-02-03 DIAGNOSIS — G3184 Mild cognitive impairment, so stated: Secondary | ICD-10-CM | POA: Diagnosis not present

## 2016-03-01 DIAGNOSIS — I10 Essential (primary) hypertension: Secondary | ICD-10-CM | POA: Diagnosis not present

## 2016-03-01 DIAGNOSIS — M1990 Unspecified osteoarthritis, unspecified site: Secondary | ICD-10-CM | POA: Diagnosis not present

## 2016-03-01 DIAGNOSIS — M810 Age-related osteoporosis without current pathological fracture: Secondary | ICD-10-CM | POA: Diagnosis not present

## 2016-03-09 DIAGNOSIS — I129 Hypertensive chronic kidney disease with stage 1 through stage 4 chronic kidney disease, or unspecified chronic kidney disease: Secondary | ICD-10-CM | POA: Diagnosis not present

## 2016-03-09 DIAGNOSIS — I10 Essential (primary) hypertension: Secondary | ICD-10-CM | POA: Diagnosis not present

## 2016-03-09 DIAGNOSIS — M25561 Pain in right knee: Secondary | ICD-10-CM | POA: Diagnosis not present

## 2016-03-16 DIAGNOSIS — G8929 Other chronic pain: Secondary | ICD-10-CM | POA: Diagnosis not present

## 2016-04-07 DIAGNOSIS — I129 Hypertensive chronic kidney disease with stage 1 through stage 4 chronic kidney disease, or unspecified chronic kidney disease: Secondary | ICD-10-CM | POA: Diagnosis not present

## 2016-04-07 DIAGNOSIS — M25519 Pain in unspecified shoulder: Secondary | ICD-10-CM | POA: Diagnosis not present

## 2016-04-14 DIAGNOSIS — M545 Low back pain: Secondary | ICD-10-CM | POA: Diagnosis not present

## 2016-04-14 DIAGNOSIS — F419 Anxiety disorder, unspecified: Secondary | ICD-10-CM | POA: Diagnosis not present

## 2016-04-14 DIAGNOSIS — I10 Essential (primary) hypertension: Secondary | ICD-10-CM | POA: Diagnosis not present

## 2016-04-14 DIAGNOSIS — M25561 Pain in right knee: Secondary | ICD-10-CM | POA: Diagnosis not present

## 2016-04-23 DIAGNOSIS — I10 Essential (primary) hypertension: Secondary | ICD-10-CM | POA: Diagnosis not present

## 2016-05-05 DIAGNOSIS — I10 Essential (primary) hypertension: Secondary | ICD-10-CM | POA: Diagnosis not present

## 2016-05-05 DIAGNOSIS — Z720 Tobacco use: Secondary | ICD-10-CM | POA: Diagnosis not present

## 2016-05-05 DIAGNOSIS — N183 Chronic kidney disease, stage 3 (moderate): Secondary | ICD-10-CM | POA: Diagnosis not present

## 2016-05-05 DIAGNOSIS — M1990 Unspecified osteoarthritis, unspecified site: Secondary | ICD-10-CM | POA: Diagnosis not present

## 2016-05-14 DIAGNOSIS — M150 Primary generalized (osteo)arthritis: Secondary | ICD-10-CM | POA: Diagnosis not present

## 2016-05-14 DIAGNOSIS — M810 Age-related osteoporosis without current pathological fracture: Secondary | ICD-10-CM | POA: Diagnosis not present

## 2016-05-14 DIAGNOSIS — I10 Essential (primary) hypertension: Secondary | ICD-10-CM | POA: Diagnosis not present

## 2016-05-20 ENCOUNTER — Other Ambulatory Visit (HOSPITAL_COMMUNITY): Payer: Self-pay | Admitting: Nephrology

## 2016-05-20 DIAGNOSIS — N183 Chronic kidney disease, stage 3 unspecified: Secondary | ICD-10-CM

## 2016-06-08 ENCOUNTER — Ambulatory Visit (HOSPITAL_COMMUNITY)
Admission: RE | Admit: 2016-06-08 | Discharge: 2016-06-08 | Disposition: A | Discharge status: Home / Self Care | Payer: Commercial Managed Care - HMO | Source: Ambulatory Visit | Attending: Nephrology | Admitting: Nephrology

## 2016-06-08 DIAGNOSIS — D519 Vitamin B12 deficiency anemia, unspecified: Secondary | ICD-10-CM | POA: Diagnosis not present

## 2016-06-08 DIAGNOSIS — Z1159 Encounter for screening for other viral diseases: Secondary | ICD-10-CM | POA: Diagnosis not present

## 2016-06-08 DIAGNOSIS — Z79899 Other long term (current) drug therapy: Secondary | ICD-10-CM | POA: Diagnosis not present

## 2016-06-08 DIAGNOSIS — N281 Cyst of kidney, acquired: Secondary | ICD-10-CM | POA: Insufficient documentation

## 2016-06-08 DIAGNOSIS — I10 Essential (primary) hypertension: Secondary | ICD-10-CM | POA: Diagnosis not present

## 2016-06-08 DIAGNOSIS — N183 Chronic kidney disease, stage 3 unspecified: Secondary | ICD-10-CM

## 2016-06-08 DIAGNOSIS — D509 Iron deficiency anemia, unspecified: Secondary | ICD-10-CM | POA: Diagnosis not present

## 2016-06-08 DIAGNOSIS — N189 Chronic kidney disease, unspecified: Secondary | ICD-10-CM | POA: Diagnosis not present

## 2016-06-08 DIAGNOSIS — E559 Vitamin D deficiency, unspecified: Secondary | ICD-10-CM | POA: Diagnosis not present

## 2016-06-08 DIAGNOSIS — R809 Proteinuria, unspecified: Secondary | ICD-10-CM | POA: Diagnosis not present

## 2016-06-25 DIAGNOSIS — E875 Hyperkalemia: Secondary | ICD-10-CM | POA: Diagnosis not present

## 2016-06-25 DIAGNOSIS — N183 Chronic kidney disease, stage 3 (moderate): Secondary | ICD-10-CM | POA: Diagnosis not present

## 2016-06-29 DIAGNOSIS — M545 Low back pain: Secondary | ICD-10-CM | POA: Diagnosis not present

## 2016-06-29 DIAGNOSIS — R600 Localized edema: Secondary | ICD-10-CM | POA: Diagnosis not present

## 2016-07-12 DIAGNOSIS — E875 Hyperkalemia: Secondary | ICD-10-CM | POA: Diagnosis not present

## 2016-07-23 DIAGNOSIS — M810 Age-related osteoporosis without current pathological fracture: Secondary | ICD-10-CM | POA: Diagnosis not present

## 2016-07-23 DIAGNOSIS — I10 Essential (primary) hypertension: Secondary | ICD-10-CM | POA: Diagnosis not present

## 2016-07-29 DIAGNOSIS — E782 Mixed hyperlipidemia: Secondary | ICD-10-CM | POA: Diagnosis not present

## 2016-07-29 DIAGNOSIS — M545 Low back pain: Secondary | ICD-10-CM | POA: Diagnosis not present

## 2016-07-29 DIAGNOSIS — M25562 Pain in left knee: Secondary | ICD-10-CM | POA: Diagnosis not present

## 2016-07-29 DIAGNOSIS — Z79899 Other long term (current) drug therapy: Secondary | ICD-10-CM | POA: Diagnosis not present

## 2016-08-26 DIAGNOSIS — I10 Essential (primary) hypertension: Secondary | ICD-10-CM | POA: Diagnosis not present

## 2016-08-26 DIAGNOSIS — E782 Mixed hyperlipidemia: Secondary | ICD-10-CM | POA: Diagnosis not present

## 2016-12-20 ENCOUNTER — Other Ambulatory Visit: Payer: Self-pay

## 2016-12-20 NOTE — Patient Outreach (Signed)
[facility]) Care Management  12/20/2016  Maria Hardy [facility] Aug 11, 1941 [id number]   Medication Adherence call Maria Hardy call Maria Hardy  because she is due on two of the medication  Lovastatin 20 mg and losartan/hctz 100/25 patient pick up one of the medication and not the other she ask if we can call  [facility] pharmacy to order her medication she did not have refill call doctor Maria Hardy to get a refill and a 90 days supply prescription.   Lillia Abed CPhT Pharmacy Technician [facility] Management Direct Dial [phone]  Fax [phone] [email or web address]

## 2017-10-18 DIAGNOSIS — E785 Hyperlipidemia, unspecified: Secondary | ICD-10-CM | POA: Diagnosis not present

## 2017-10-18 DIAGNOSIS — F419 Anxiety disorder, unspecified: Secondary | ICD-10-CM | POA: Diagnosis not present

## 2017-10-18 DIAGNOSIS — Z79899 Other long term (current) drug therapy: Secondary | ICD-10-CM | POA: Diagnosis not present

## 2017-10-18 DIAGNOSIS — I10 Essential (primary) hypertension: Secondary | ICD-10-CM | POA: Diagnosis not present

## 2017-10-18 DIAGNOSIS — Z733 Stress, not elsewhere classified: Secondary | ICD-10-CM | POA: Diagnosis not present

## 2017-10-26 DIAGNOSIS — M1990 Unspecified osteoarthritis, unspecified site: Secondary | ICD-10-CM | POA: Diagnosis not present

## 2017-10-26 DIAGNOSIS — Z79899 Other long term (current) drug therapy: Secondary | ICD-10-CM | POA: Diagnosis not present

## 2017-10-26 DIAGNOSIS — E785 Hyperlipidemia, unspecified: Secondary | ICD-10-CM | POA: Diagnosis not present

## 2017-10-26 DIAGNOSIS — I10 Essential (primary) hypertension: Secondary | ICD-10-CM | POA: Diagnosis not present

## 2017-10-26 DIAGNOSIS — Z131 Encounter for screening for diabetes mellitus: Secondary | ICD-10-CM | POA: Diagnosis not present

## 2017-10-26 DIAGNOSIS — Z1329 Encounter for screening for other suspected endocrine disorder: Secondary | ICD-10-CM | POA: Diagnosis not present

## 2017-10-26 DIAGNOSIS — Z13220 Encounter for screening for lipoid disorders: Secondary | ICD-10-CM | POA: Diagnosis not present

## 2017-11-01 DIAGNOSIS — Z713 Dietary counseling and surveillance: Secondary | ICD-10-CM | POA: Diagnosis not present

## 2017-11-01 DIAGNOSIS — E860 Dehydration: Secondary | ICD-10-CM | POA: Diagnosis not present

## 2017-11-01 DIAGNOSIS — I10 Essential (primary) hypertension: Secondary | ICD-10-CM | POA: Diagnosis not present

## 2017-11-01 DIAGNOSIS — F419 Anxiety disorder, unspecified: Secondary | ICD-10-CM | POA: Diagnosis not present

## 2017-11-01 DIAGNOSIS — G4700 Insomnia, unspecified: Secondary | ICD-10-CM | POA: Diagnosis not present

## 2017-11-01 DIAGNOSIS — E785 Hyperlipidemia, unspecified: Secondary | ICD-10-CM | POA: Diagnosis not present

## 2017-11-01 DIAGNOSIS — Z23 Encounter for immunization: Secondary | ICD-10-CM | POA: Diagnosis not present

## 2017-11-01 DIAGNOSIS — Z0001 Encounter for general adult medical examination with abnormal findings: Secondary | ICD-10-CM | POA: Diagnosis not present

## 2017-11-01 DIAGNOSIS — R739 Hyperglycemia, unspecified: Secondary | ICD-10-CM | POA: Diagnosis not present

## 2017-11-01 DIAGNOSIS — M810 Age-related osteoporosis without current pathological fracture: Secondary | ICD-10-CM | POA: Diagnosis not present

## 2017-11-02 ENCOUNTER — Other Ambulatory Visit (HOSPITAL_COMMUNITY): Payer: Self-pay | Admitting: Family Medicine

## 2017-11-02 DIAGNOSIS — Z780 Asymptomatic menopausal state: Secondary | ICD-10-CM

## 2018-01-19 ENCOUNTER — Encounter (HOSPITAL_COMMUNITY): Payer: Self-pay

## 2018-01-19 ENCOUNTER — Other Ambulatory Visit (HOSPITAL_COMMUNITY): Payer: Medicare HMO

## 2018-05-29 IMAGING — US US RENAL
1 series · 14 of 25 positions shown · non-contrast
Comparison: No recent prior.

CLINICAL DATA: Chronic renal disease.

EXAM:
RENAL / URINARY TRACT ULTRASOUND COMPLETE

[Series 1: us renal · 0.28mm/px · 14 of 56 slices shown]
[im 1/56]
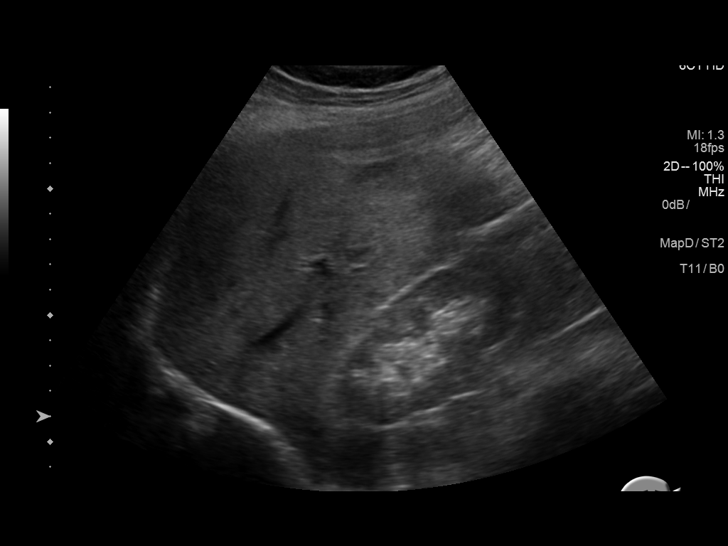
[im 5/56]
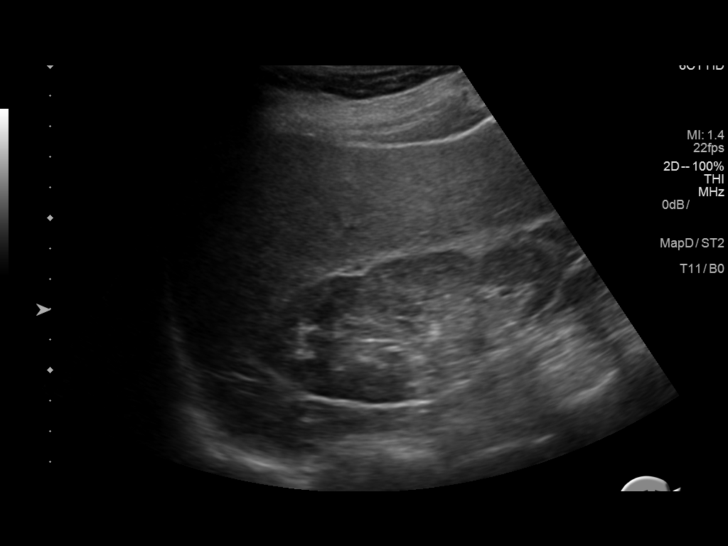
[im 10/56]
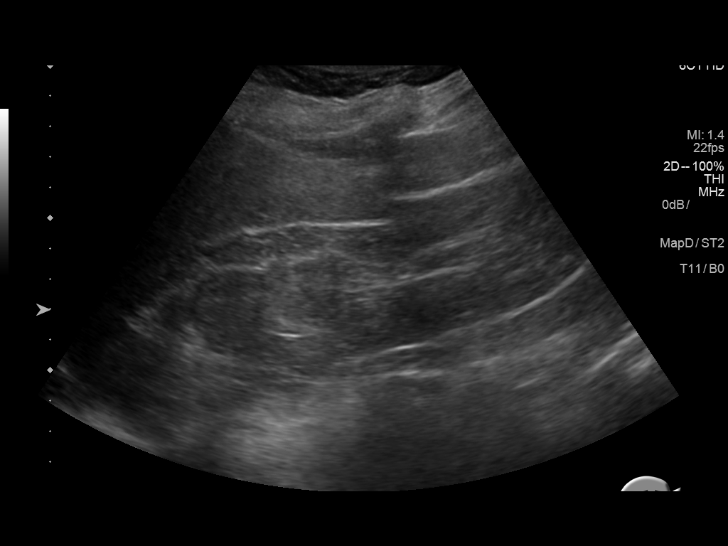
[im 14/56]
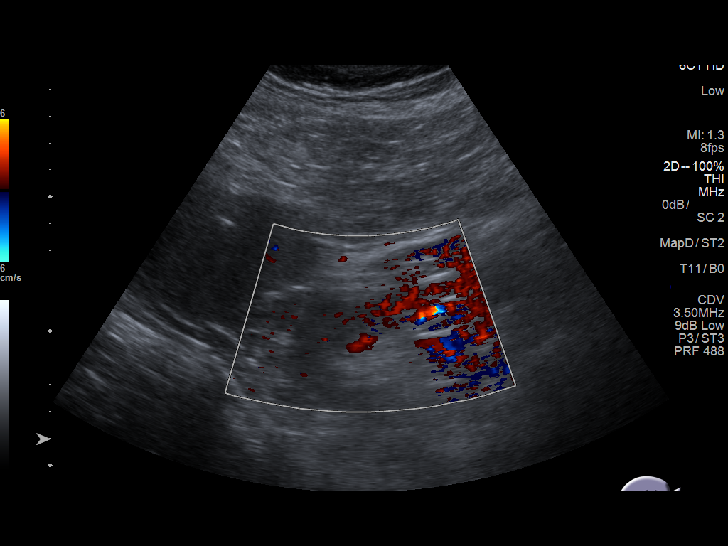
[im 19/56]
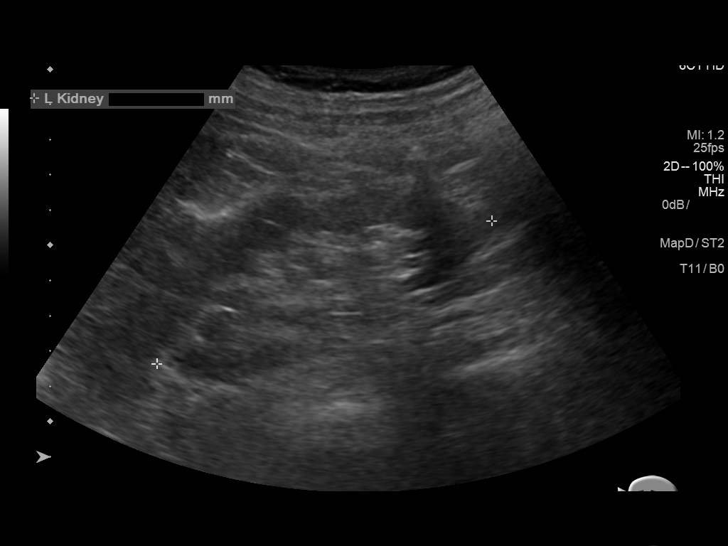
[im 21/56]
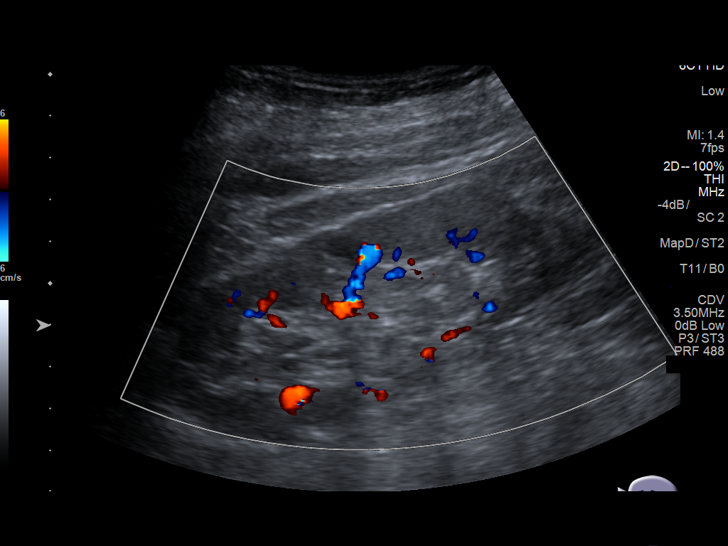
[im 26/56]
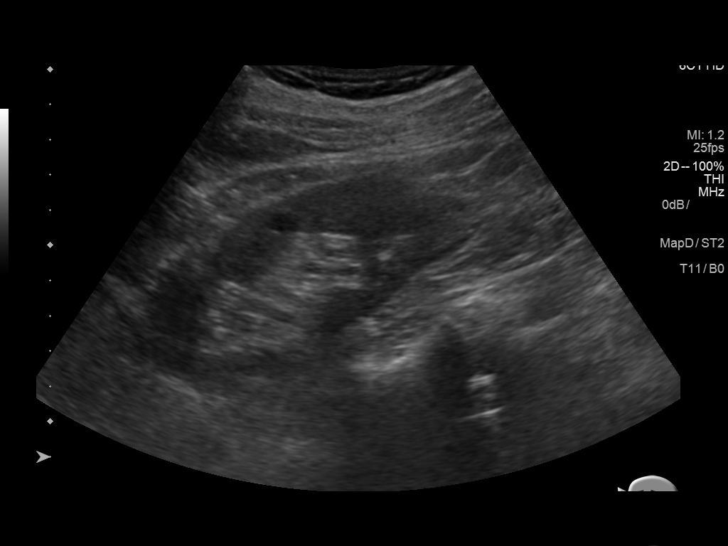
[im 30/56]
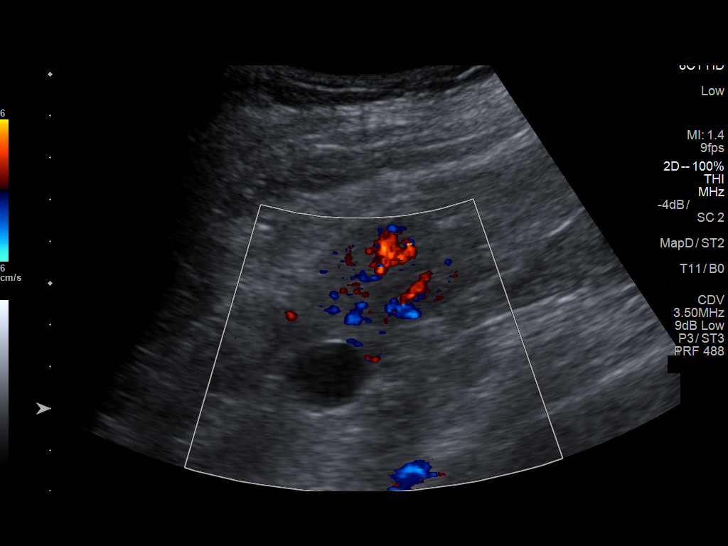
[im 35/56]
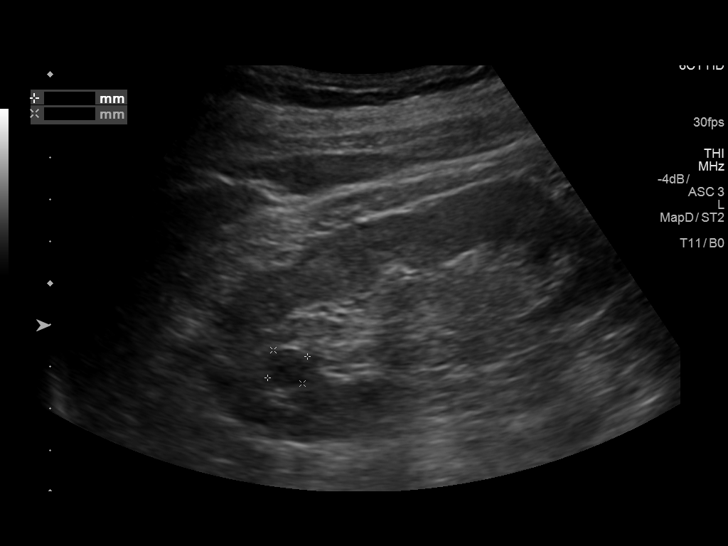
[im 37/56]
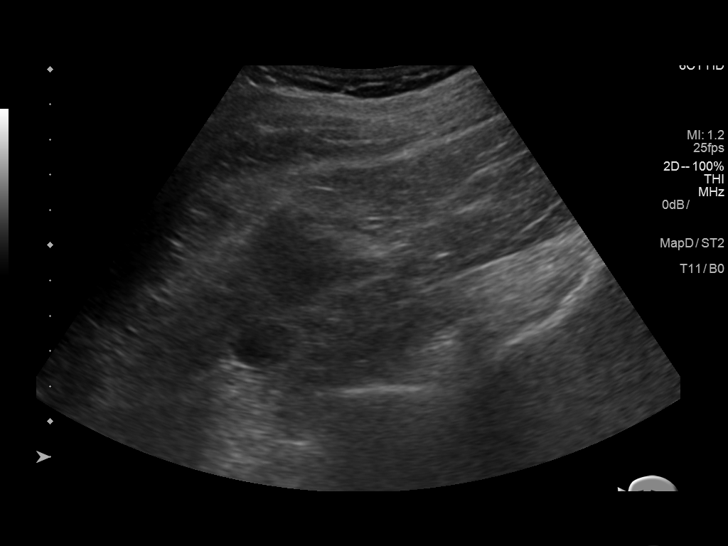
[im 42/56]
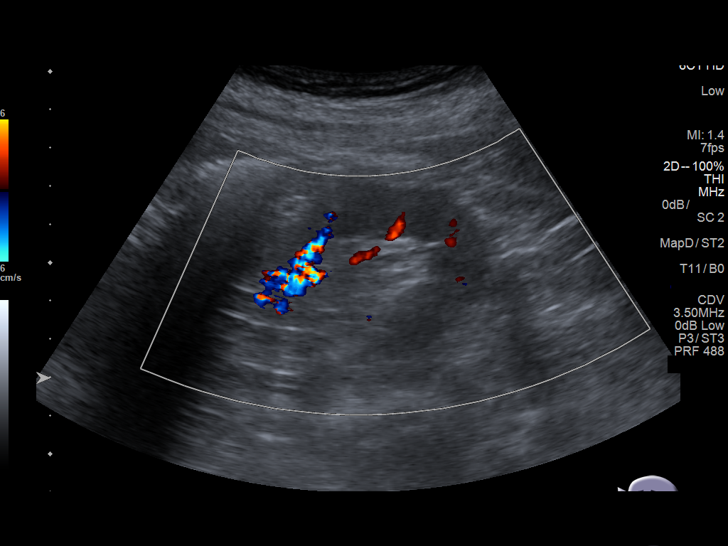
[im 46/56]
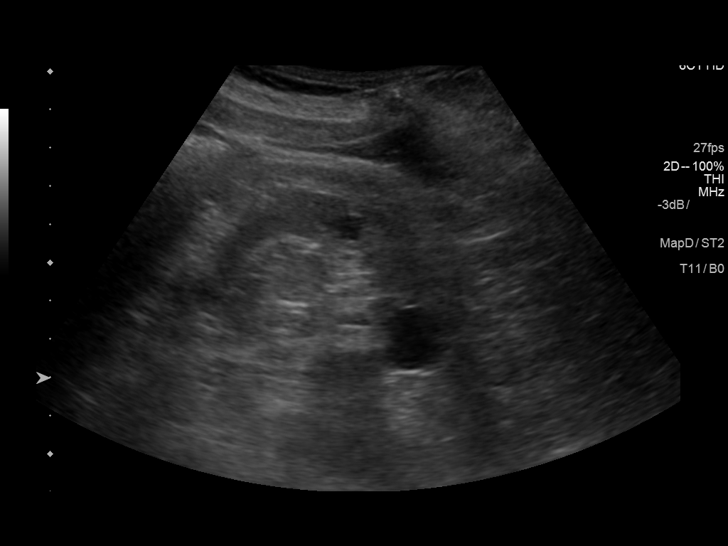
[im 51/56]
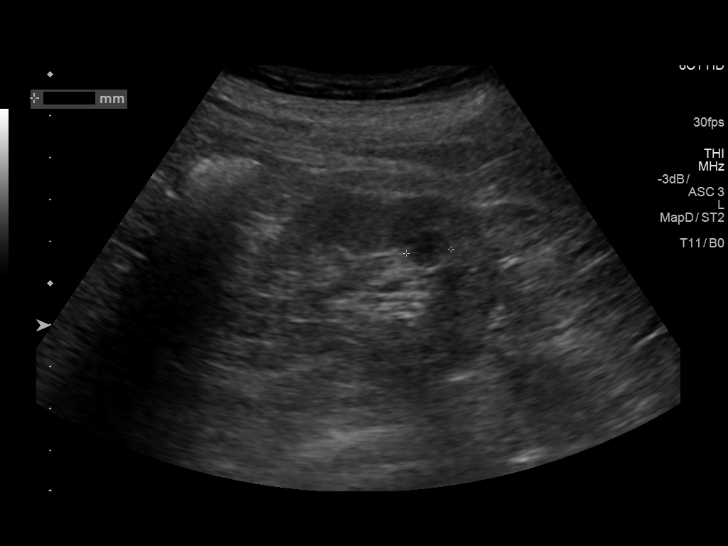
[im 56/56]
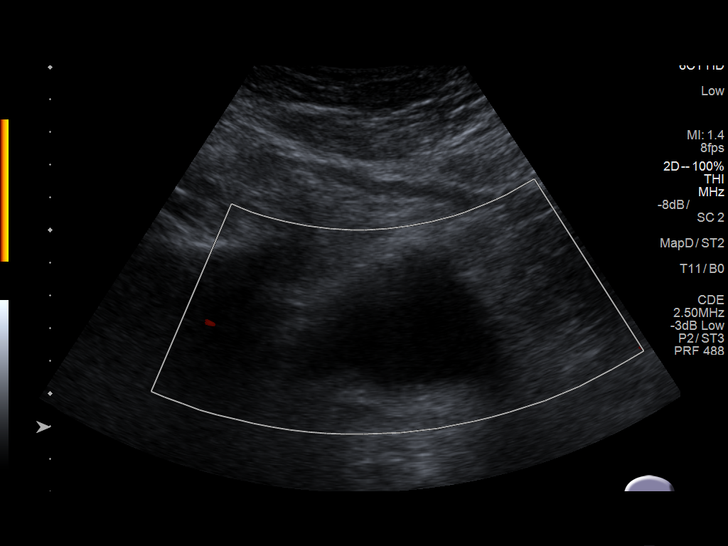

[14 of 25 positions shown; findings below may reference images not displayed]

FINDINGS: Right Kidney:

Length: 10.7 cm. Mild renal contour irregularity, cortical scarring
cannot be excluded. Echogenicity within normal limits. No mass or
hydronephrosis visualized.

Left Kidney:

Length: 10.6 cm. Mild renal contour irregularity, cortical scarring
cannot be excluded. Echogenicity within normal limits. No
hydronephrosis visualized. A 1.7 x 1.7 x 1.7 and adjacent 1.1 x
cm simple cyst left upper renal pole noted. A 0.9 x 0.8 x 1.1 cm
complex cyst with faint internal echoes lower kidney noted.

Bladder:

Appears normal for degree of bladder distention.
IMPRESSION: 1. Tiny 0.9 x 0.8 x 1.1 cm complex cyst with faint internal echoes
noted in the lower portion of the left kidney. Further evaluation
with MRI can be obtained as needed.

2. 2 small simple cysts in the upper pole left kidney as above. No
acute abnormality identified. No hydronephrosis or bladder
distention.

3. Mild bilateral renal cortical irregularity, renal scarring cannot
be excluded .

## 2020-06-23 ENCOUNTER — Other Ambulatory Visit: Payer: Self-pay

## 2020-06-23 ENCOUNTER — Encounter (HOSPITAL_COMMUNITY): Payer: Self-pay | Admitting: Emergency Medicine

## 2020-06-23 ENCOUNTER — Emergency Department (HOSPITAL_COMMUNITY)
Admission: EM | Admit: 2020-06-23 | Discharge: 2020-06-23 | Disposition: A | Discharge status: Home / Self Care | Payer: Medicare HMO | Attending: Emergency Medicine | Admitting: Emergency Medicine

## 2020-06-23 DIAGNOSIS — I10 Essential (primary) hypertension: Secondary | ICD-10-CM | POA: Insufficient documentation

## 2020-06-23 DIAGNOSIS — R197 Diarrhea, unspecified: Secondary | ICD-10-CM | POA: Insufficient documentation

## 2020-06-23 DIAGNOSIS — F17210 Nicotine dependence, cigarettes, uncomplicated: Secondary | ICD-10-CM | POA: Insufficient documentation

## 2020-06-23 HISTORY — DX: Essential (primary) hypertension: I10

## 2020-06-23 HISTORY — DX: Dizziness and giddiness: R42

## 2020-06-23 LAB — COMPREHENSIVE METABOLIC PANEL
ALT: 7 U/L (ref 0–44)
AST: 14 U/L — ABNORMAL LOW (ref 15–41)
Albumin: 3.2 g/dL — ABNORMAL LOW (ref 3.5–5.0)
Alkaline Phosphatase: 57 U/L (ref 38–126)
Anion gap: 8 (ref 5–15)
BUN: 22 mg/dL (ref 8–23)
CO2: 24 mmol/L (ref 22–32)
Calcium: 8.7 mg/dL — ABNORMAL LOW (ref 8.9–10.3)
Chloride: 105 mmol/L (ref 98–111)
Creatinine, Ser: 0.83 mg/dL (ref 0.44–1.00)
GFR, Estimated: >60 mL/min (ref >60)
Glucose, Bld: 116 mg/dL — ABNORMAL HIGH (ref 70–99)
Potassium: 4 mmol/L (ref 3.5–5.1)
Sodium: 137 mmol/L (ref 135–145)
Total Bilirubin: 0.9 mg/dL (ref 0.3–1.2)
Total Protein: 6 g/dL — ABNORMAL LOW (ref 6.5–8.1)

## 2020-06-23 LAB — URINALYSIS, ROUTINE W REFLEX MICROSCOPIC
Bilirubin Urine: NEGATIVE
Glucose, UA: NEGATIVE mg/dL
Hgb urine dipstick: NEGATIVE
Ketones, ur: NEGATIVE mg/dL
Leukocytes,Ua: NEGATIVE
Nitrite: NEGATIVE
Protein, ur: NEGATIVE mg/dL
Specific Gravity, Urine: 1.02 (ref 1.005–1.030)
pH: 5 (ref 5.0–8.0)

## 2020-06-23 LAB — CBC
HCT: 36.8 % (ref 36.0–46.0)
Hemoglobin: 12 g/dL (ref 12.0–15.0)
MCH: 30.7 pg (ref 26.0–34.0)
MCHC: 32.6 g/dL (ref 30.0–36.0)
MCV: 94.1 fL (ref 80.0–100.0)
Platelets: 316 10*3/uL (ref 150–400)
RBC: 3.91 MIL/uL (ref 3.87–5.11)
RDW: 13.3 % (ref 11.5–15.5)
WBC: 11.3 10*3/uL — ABNORMAL HIGH (ref 4.0–10.5)
nRBC: 0 % (ref 0.0–0.2)

## 2020-06-23 LAB — LIPASE, BLOOD: Lipase: 22 U/L (ref 11–51)

## 2020-06-23 NOTE — ED Triage Notes (Signed)
Patient c/o diarrhea and abd bloating x4 weeks. Per patient seen by PCP x3. Per patient told it was "abd spasms." Patient has given PCP x2 stool samples, in which one was lost and she hasn't had results given for the second. Patient taking imodium with no relief. Patient denies any nausea, vomiting, fever, or abd pain. Denies any blood in stool.

## 2020-06-23 NOTE — ED Notes (Signed)
UA sent.  Urine dark

## 2020-06-23 NOTE — ED Provider Notes (Signed)
[facility]-EMERGENCY DEPT [facility] Emergency Department Provider Note MRN:  [id number]  Arrival date & time: 06/23/20     Chief Complaint   Diarrhea   History of Present Illness   Maria Hardy is a 79 y.o. year-old female with a history of HTN presenting to the ED with chief complaint of diarrhea.  2-3 weeks of diarrhea, watery with 2-3 episodes in the morning and then a few episodes during the evening.  Has followed up with PCP and has had some stool samples drawn but she is told that they have lost her stool sample.  She otherwise has no complaints, no lightheadedness, no headache, no vision change, no chest pain, no shortness of breath, no abdominal pain, no fever, no nausea, no vomiting.  Review of Systems  A complete 10 system review of systems was obtained and all systems are negative except as noted in the HPI and PMH.   Patient's Health History    Past Medical History:  Diagnosis Date  . Arthritis   . Hypertension   . Vertigo     Past Surgical History:  Procedure Laterality Date  . thyroid tumopr remo N/A   . THYROIDECTOMY      History reviewed. No pertinent family history.  Social History   Socioeconomic History  . Marital status: Widowed    Spouse name: Not on file  . Number of children: Not on file  . Years of education: Not on file  . Highest education level: Not on file  Occupational History  . Not on file  Tobacco Use  . Smoking status: Current Every Day Smoker    Packs/day: 0.50    Years: 58.00    Pack years: 29.00    Types: Cigarettes  . Smokeless tobacco: Never Used  Substance and Sexual Activity  . Alcohol use: No  . Drug use: No  . Sexual activity: Not on file  Other Topics Concern  . Not on file  Social History Narrative  . Not on file   Social Determinants of Health   Financial Resource Strain:   . Difficulty of Paying Living Expenses: Not on file  Food Insecurity:   . Worried About Programme researcher, broadcasting/film/video in the Last Year: Not on  file  . Ran Out of Food in the Last Year: Not on file  Transportation Needs:   . Lack of Transportation (Medical): Not on file  . Lack of Transportation (Non-Medical): Not on file  Physical Activity:   . Days of Exercise per Week: Not on file  . Minutes of Exercise per Session: Not on file  Stress:   . Feeling of Stress : Not on file  Social Connections:   . Frequency of Communication with Friends and Family: Not on file  . Frequency of Social Gatherings with Friends and Family: Not on file  . Attends Religious Services: Not on file  . Active Member of Clubs or Organizations: Not on file  . Attends Banker Meetings: Not on file  . Marital Status: Not on file  Intimate Partner Violence:   . Fear of Current or Ex-Partner: Not on file  . Emotionally Abused: Not on file  . Physically Abused: Not on file  . Sexually Abused: Not on file     Physical Exam   Vitals:   06/23/20 0833  BP: (!) 166/77  Pulse: 66  Resp: 18  Temp: 98.5 F (36.9 C)  SpO2: 100%    CONSTITUTIONAL: Well-appearing, NAD NEURO:  Alert and  oriented x 3, no focal deficits EYES:  eyes equal and reactive ENT/NECK:  no LAD, no JVD CARDIO: Regular rate, well-perfused, normal S1 and S2 PULM:  CTAB no wheezing or rhonchi GI/GU:  normal bowel sounds, non-distended, non-tender MSK/SPINE:  No gross deformities, no edema SKIN:  no rash, atraumatic PSYCH:  Appropriate speech and behavior  *Additional and/or pertinent findings included in MDM below  Diagnostic and Interventional Summary    EKG Interpretation  Date/Time:  Monday June 23 2020 08:46:42 EST Ventricular Rate:  59 PR Interval:    QRS Duration: 135 QT Interval:  441 QTC Calculation: 437 R Axis:   83 Text Interpretation: Sinus rhythm IVCD, consider atypical RBBB Confirmed by Kennis Carina [phone]) on 06/23/2020 10:05:41 AM      Labs Reviewed  COMPREHENSIVE METABOLIC PANEL - Abnormal; Notable for the following components:       Result Value   Glucose, Bld 116 (*)    Calcium 8.7 (*)    Total Protein 6.0 (*)    Albumin 3.2 (*)    AST 14 (*)    All other components within normal limits  CBC - Abnormal; Notable for the following components:   WBC 11.3 (*)    All other components within normal limits  URINALYSIS, ROUTINE W REFLEX MICROSCOPIC - Abnormal; Notable for the following components:   APPearance HAZY (*)    All other components within normal limits  LIPASE, BLOOD    No orders to display    Medications - No data to display   Procedures  /  Critical Care Procedures  ED Course and Medical Decision Making  I have reviewed the triage vital signs, the nursing notes, and pertinent available records from the EMR.  Listed above are laboratory and imaging tests that I personally ordered, reviewed, and interpreted and then considered in my medical decision making (see below for details).  Diarrhea for 2 or 3 weeks, given the duration now considering a malabsorptive process rather than infectious.  No abdominal tenderness, no fever, no other complaints.  No indication for imaging today.  Labs are reassuring, appropriate for continued PCP follow-up.       Elmer Sow. Pilar Plate, MD [facility] Emergency Medicine [facility] [email or web address]  Final Clinical Impressions(s) / ED Diagnoses     ICD-10-CM   1. Diarrhea, unspecified type  R19.7     ED Discharge Orders    None       Discharge Instructions Discussed with and Provided to Patient:   Discharge Instructions   None      Sabas Sous, MD 06/23/20 1037

## 2020-06-23 NOTE — Discharge Instructions (Addendum)
You were evaluated in the Emergency Department and after careful evaluation, we did not find any emergent condition requiring admission or further testing in the hospital.  Your exam/testing today was overall reassuring.  Labs did not show any abnormalities.  We recommend continued follow-up with your primary care doctor for stool testing.  Please return to the Emergency Department if you experience any worsening of your condition.  Thank you for allowing [place] to be a part of your care.

## 2020-09-23 ENCOUNTER — Ambulatory Visit (INDEPENDENT_AMBULATORY_CARE_PROVIDER_SITE_OTHER): Payer: Medicare HMO | Admitting: Internal Medicine

## 2021-08-25 DIAGNOSIS — N1832 Chronic kidney disease, stage 3b: Secondary | ICD-10-CM | POA: Insufficient documentation

## 2022-03-01 ENCOUNTER — Encounter: Payer: Self-pay | Admitting: Orthopedic Surgery

## 2022-03-01 ENCOUNTER — Ambulatory Visit: Payer: Medicare Other | Admitting: Orthopedic Surgery

## 2022-06-03 ENCOUNTER — Ambulatory Visit (INDEPENDENT_AMBULATORY_CARE_PROVIDER_SITE_OTHER): Payer: Medicare Other | Admitting: Orthopedic Surgery

## 2022-06-03 ENCOUNTER — Ambulatory Visit (INDEPENDENT_AMBULATORY_CARE_PROVIDER_SITE_OTHER): Payer: Medicare Other

## 2022-06-03 ENCOUNTER — Encounter: Payer: Self-pay | Admitting: Orthopedic Surgery

## 2022-06-03 VITALS — BP 156/80 | HR 59 | Ht 66.0 in | Wt 154.0 lb

## 2022-06-03 DIAGNOSIS — M25561 Pain in right knee: Secondary | ICD-10-CM

## 2022-06-03 DIAGNOSIS — M545 Low back pain, unspecified: Secondary | ICD-10-CM | POA: Diagnosis not present

## 2022-06-03 DIAGNOSIS — M4316 Spondylolisthesis, lumbar region: Secondary | ICD-10-CM

## 2022-06-03 DIAGNOSIS — G8929 Other chronic pain: Secondary | ICD-10-CM

## 2022-06-03 DIAGNOSIS — M5137 Other intervertebral disc degeneration, lumbosacral region: Secondary | ICD-10-CM

## 2022-06-03 MED ORDER — DICLOFENAC SODIUM 75 MG PO TBEC: 75.0000 mg | DELAYED_RELEASE_TABLET | Freq: Two times a day (BID) | ORAL | 2 refills | Status: DC

## 2022-06-03 MED ORDER — TIZANIDINE HCL 4 MG PO TABS: 4.0000 mg | ORAL_TABLET | Freq: Four times a day (QID) | ORAL | 0 refills | Status: DC | PRN

## 2022-06-03 NOTE — Progress Notes (Signed)
Chief Complaint  Patient presents with   Back Pain   Knee Pain   81 year old female with following review of systems blurred vision ringing of the ears easy bruising and bleeding dizziness  She has a history of hypertension osteoporosis she was a history of being a smoker she is on amlodipine and losartan and medication for vertigo she is not on any anticoagulation  She reports symptoms for the last year or more with pain in the lower back radiating down the right leg into the anterolateral compartment sometimes localizing to the right knee  BP (!) 156/80   Pulse (!) 59   Ht 5\' 6"  (1.676 m)   Wt 154 lb (69.9 kg)   BMI 24.86 kg/m   She uses a cane she is ambulatory  She is awake and alert she is oriented x3  Mood is pleasant her affect is normal  She moves her right knee fine there is no swelling or tenderness I do not detect any instability  Right hip and left hip range of motion is normal without pain  She has lower back tenderness and right gluteal tenderness  There is tenderness in the anterolateral compartment of the leg without neurovascular compromise  Her extremities are slightly cool but pulses are palpable and intact  Images were ordered for the lumbar spine and right knee   Meds ordered this encounter  Medications   tiZANidine (ZANAFLEX) 4 MG tablet    Sig: Take 1 tablet (4 mg total) by mouth every 6 (six) hours as needed for muscle spasms.    Dispense:  30 tablet    Refill:  0   diclofenac (VOLTAREN) 75 MG EC tablet    Sig: Take 1 tablet (75 mg total) by mouth 2 (two) times daily with a meal.    Dispense:  60 tablet    Refill:  2    Tizanidine Tylenol Diclofenac 75  Physical therapy  Fu prn

## 2022-06-03 NOTE — Addendum Note (Signed)
Addended byCandice Camp on: 06/03/2022 12:04 PM   Modules accepted: Orders

## 2022-06-03 NOTE — Patient Instructions (Addendum)
For pain use heating pad on your lower back 30 min 3 x a day   Start tylenol 500 mg every 6 hrs   Take Tizanidine 4 mg every 6 hrs   Take Diclofenac 75 twice a day   You will need 4 weeks of therapy on your back   Physical therapy has been ordered for you at [facility] They should call you to schedule, [phone]  is the phone number to call, if you want to call to schedule.

## 2022-06-30 ENCOUNTER — Other Ambulatory Visit (HOSPITAL_COMMUNITY): Payer: Self-pay | Admitting: Family Medicine

## 2022-06-30 DIAGNOSIS — M810 Age-related osteoporosis without current pathological fracture: Secondary | ICD-10-CM

## 2022-08-18 ENCOUNTER — Other Ambulatory Visit (HOSPITAL_COMMUNITY): Payer: Medicare Other

## 2022-08-23 ENCOUNTER — Other Ambulatory Visit (HOSPITAL_COMMUNITY): Payer: Medicare Other

## 2022-08-26 ENCOUNTER — Encounter: Payer: Self-pay | Admitting: Orthopedic Surgery

## 2022-08-26 ENCOUNTER — Ambulatory Visit (INDEPENDENT_AMBULATORY_CARE_PROVIDER_SITE_OTHER): Payer: 59 | Admitting: Orthopedic Surgery

## 2022-08-26 DIAGNOSIS — M4316 Spondylolisthesis, lumbar region: Secondary | ICD-10-CM

## 2022-08-26 DIAGNOSIS — G8929 Other chronic pain: Secondary | ICD-10-CM

## 2022-08-26 DIAGNOSIS — M25561 Pain in right knee: Secondary | ICD-10-CM | POA: Diagnosis not present

## 2022-08-26 DIAGNOSIS — M51379 Other intervertebral disc degeneration, lumbosacral region without mention of lumbar back pain or lower extremity pain: Secondary | ICD-10-CM

## 2022-08-26 DIAGNOSIS — M5137 Other intervertebral disc degeneration, lumbosacral region: Secondary | ICD-10-CM

## 2022-08-26 DIAGNOSIS — M545 Low back pain, unspecified: Secondary | ICD-10-CM

## 2022-08-26 MED ORDER — METHYLPREDNISOLONE ACETATE 40 MG/ML IJ SUSP
40.0000 mg | Freq: Once | INTRAMUSCULAR | Status: AC
  Administered 2022-08-26: 40 mg via INTRA_ARTICULAR

## 2022-08-26 NOTE — Patient Instructions (Signed)

## 2022-08-26 NOTE — Progress Notes (Signed)
Chief Complaint  Patient presents with   Follow-up    Recheck on back pain    Encounter Diagnoses  Name Primary?   Lumbar pain    Spondylolisthesis, lumbar region    DDD (degenerative disc disease), lumbosacral    Chronic pain of right knee Yes   Back improved after therapy    Actually complains of some right knee pain.  She said her doctor said she can get an injection  Her x-rays show mild arthritis probably a grade 2 based on the amount of narrowing  She has global tenderness no effusion she bends the knee well straightens a good strength is excellent and the stability tests are normal  Okay to inject  Procedure note right knee injection   verbal consent was obtained to inject right knee joint  Timeout was completed to confirm the site of injection  The medications used were depomedrol 40 mg and 1% lidocaine 3 cc Anesthesia was provided by ethyl chloride and the skin was prepped with alcohol.  After cleaning the skin with alcohol a 20-gauge needle was used to inject the right knee joint. There were no complications. A sterile bandage was applied.

## 2022-09-06 ENCOUNTER — Other Ambulatory Visit (HOSPITAL_COMMUNITY): Payer: Medicare Other

## 2022-09-09 ENCOUNTER — Other Ambulatory Visit: Payer: Self-pay | Admitting: Orthopedic Surgery

## 2022-09-09 DIAGNOSIS — M545 Low back pain, unspecified: Secondary | ICD-10-CM

## 2022-09-09 DIAGNOSIS — M4316 Spondylolisthesis, lumbar region: Secondary | ICD-10-CM

## 2022-09-09 DIAGNOSIS — G8929 Other chronic pain: Secondary | ICD-10-CM

## 2022-09-09 DIAGNOSIS — M5137 Other intervertebral disc degeneration, lumbosacral region: Secondary | ICD-10-CM

## 2022-09-09 MED ORDER — DICLOFENAC SODIUM 75 MG PO TBEC: 75.0000 mg | DELAYED_RELEASE_TABLET | Freq: Two times a day (BID) | ORAL | 2 refills | Status: DC

## 2022-09-09 NOTE — Telephone Encounter (Signed)
Patient called, requesting a refill on her Diclofenac 75mg , 60 quantity, 2 a day to be sent to [facility].  Pt's # [phone]

## 2022-09-15 ENCOUNTER — Other Ambulatory Visit (HOSPITAL_COMMUNITY): Payer: Self-pay | Admitting: Family Medicine

## 2022-09-15 ENCOUNTER — Ambulatory Visit (HOSPITAL_COMMUNITY)
Admission: RE | Admit: 2022-09-15 | Discharge: 2022-09-15 | Disposition: A | Discharge status: Home / Self Care | Payer: 59 | Source: Ambulatory Visit | Attending: Family Medicine | Admitting: Family Medicine

## 2022-09-15 DIAGNOSIS — M810 Age-related osteoporosis without current pathological fracture: Secondary | ICD-10-CM

## 2022-09-15 DIAGNOSIS — Z1231 Encounter for screening mammogram for malignant neoplasm of breast: Secondary | ICD-10-CM

## 2022-09-22 ENCOUNTER — Encounter (HOSPITAL_COMMUNITY): Payer: Self-pay

## 2022-09-22 ENCOUNTER — Ambulatory Visit (HOSPITAL_COMMUNITY)
Admission: RE | Admit: 2022-09-22 | Discharge: 2022-09-22 | Disposition: A | Discharge status: Home / Self Care | Payer: 59 | Source: Ambulatory Visit | Attending: Family Medicine | Admitting: Family Medicine

## 2022-09-22 DIAGNOSIS — Z1231 Encounter for screening mammogram for malignant neoplasm of breast: Secondary | ICD-10-CM | POA: Insufficient documentation

## 2022-10-04 ENCOUNTER — Other Ambulatory Visit (HOSPITAL_COMMUNITY): Payer: Self-pay | Admitting: Family Medicine

## 2022-10-04 DIAGNOSIS — N6320 Unspecified lump in the left breast, unspecified quadrant: Secondary | ICD-10-CM

## 2022-10-21 ENCOUNTER — Ambulatory Visit (HOSPITAL_COMMUNITY)
Admission: RE | Admit: 2022-10-21 | Discharge: 2022-10-21 | Disposition: A | Discharge status: Home / Self Care | Payer: 59 | Source: Ambulatory Visit | Attending: Family Medicine | Admitting: Family Medicine

## 2022-10-21 ENCOUNTER — Encounter (HOSPITAL_COMMUNITY): Payer: Self-pay

## 2022-10-21 DIAGNOSIS — N6321 Unspecified lump in the left breast, upper outer quadrant: Secondary | ICD-10-CM | POA: Insufficient documentation

## 2022-10-21 DIAGNOSIS — N6320 Unspecified lump in the left breast, unspecified quadrant: Secondary | ICD-10-CM

## 2022-10-22 ENCOUNTER — Other Ambulatory Visit (HOSPITAL_COMMUNITY): Payer: Self-pay | Admitting: Family Medicine

## 2022-10-22 DIAGNOSIS — N6320 Unspecified lump in the left breast, unspecified quadrant: Secondary | ICD-10-CM

## 2022-10-22 DIAGNOSIS — R928 Other abnormal and inconclusive findings on diagnostic imaging of breast: Secondary | ICD-10-CM

## 2022-11-02 ENCOUNTER — Encounter (HOSPITAL_COMMUNITY): Payer: Self-pay

## 2022-11-02 ENCOUNTER — Ambulatory Visit (HOSPITAL_COMMUNITY)
Admission: RE | Admit: 2022-11-02 | Discharge: 2022-11-02 | Disposition: A | Discharge status: Home / Self Care | Payer: 59 | Source: Ambulatory Visit | Attending: Family Medicine | Admitting: Family Medicine

## 2022-11-02 ENCOUNTER — Encounter (HOSPITAL_COMMUNITY): Payer: 59

## 2022-11-02 DIAGNOSIS — N6320 Unspecified lump in the left breast, unspecified quadrant: Secondary | ICD-10-CM

## 2022-11-02 DIAGNOSIS — R928 Other abnormal and inconclusive findings on diagnostic imaging of breast: Secondary | ICD-10-CM

## 2022-11-02 HISTORY — PX: BREAST BIOPSY: SHX20

## 2022-11-02 MED ORDER — LIDOCAINE HCL (PF) 2 % IJ SOLN
INTRAMUSCULAR | Status: AC
  Administered 2022-11-02: 10 mL
  Filled 2022-11-02: qty 10

## 2022-11-02 MED ORDER — LIDOCAINE-EPINEPHRINE (PF) 1 %-1:200000 IJ SOLN
INTRAMUSCULAR | Status: AC
  Administered 2022-11-02: 10 mL
  Filled 2022-11-02: qty 30

## 2022-11-02 NOTE — Progress Notes (Signed)
PT tolerated left breast biopsies well today with NAD noted. PT verbalized understanding of discharge instructions. PT taken via wheelchair back to the mammogram area this time and given ice packs to use at home.

## 2022-11-02 NOTE — Progress Notes (Signed)
Both breast biopsy samples taken to lab at this time for processing by [place].

## 2022-11-04 LAB — SURGICAL PATHOLOGY

## 2022-11-10 ENCOUNTER — Inpatient Hospital Stay: Payer: 59 | Attending: Hematology | Admitting: Hematology

## 2022-11-10 ENCOUNTER — Inpatient Hospital Stay: Payer: 59

## 2022-11-10 ENCOUNTER — Encounter: Payer: Self-pay | Admitting: Hematology

## 2022-11-10 VITALS — BP 158/96 | HR 60 | Temp 98.4°F | Resp 18 | Ht 66.0 in | Wt 169.1 lb

## 2022-11-10 DIAGNOSIS — F17210 Nicotine dependence, cigarettes, uncomplicated: Secondary | ICD-10-CM | POA: Diagnosis not present

## 2022-11-10 DIAGNOSIS — Z79899 Other long term (current) drug therapy: Secondary | ICD-10-CM | POA: Insufficient documentation

## 2022-11-10 DIAGNOSIS — M810 Age-related osteoporosis without current pathological fracture: Secondary | ICD-10-CM | POA: Insufficient documentation

## 2022-11-10 DIAGNOSIS — C50412 Malignant neoplasm of upper-outer quadrant of left female breast: Secondary | ICD-10-CM | POA: Insufficient documentation

## 2022-11-10 DIAGNOSIS — Z170 Estrogen receptor positive status [ER+]: Secondary | ICD-10-CM | POA: Diagnosis not present

## 2022-11-10 DIAGNOSIS — I10 Essential (primary) hypertension: Secondary | ICD-10-CM | POA: Insufficient documentation

## 2022-11-10 DIAGNOSIS — D0512 Intraductal carcinoma in situ of left breast: Secondary | ICD-10-CM | POA: Insufficient documentation

## 2022-11-10 LAB — CBC WITH DIFFERENTIAL/PLATELET
Abs Immature Granulocytes: 0.02 10*3/uL (ref 0.00–0.07)
Basophils Absolute: 0 10*3/uL (ref 0.0–0.1)
Basophils Relative: 0 %
Eosinophils Absolute: 0.1 10*3/uL (ref 0.0–0.5)
Eosinophils Relative: 1 %
HCT: 39.2 % (ref 36.0–46.0)
Hemoglobin: 12.8 g/dL (ref 12.0–15.0)
Immature Granulocytes: 0 %
Lymphocytes Relative: 19 %
Lymphs Abs: 1.5 10*3/uL (ref 0.7–4.0)
MCH: 30.8 pg (ref 26.0–34.0)
MCHC: 32.7 g/dL (ref 30.0–36.0)
MCV: 94.2 fL (ref 80.0–100.0)
Monocytes Absolute: 0.6 10*3/uL (ref 0.1–1.0)
Monocytes Relative: 7 %
Neutro Abs: 5.8 10*3/uL (ref 1.7–7.7)
Neutrophils Relative %: 73 %
Platelets: 268 10*3/uL (ref 150–400)
RBC: 4.16 MIL/uL (ref 3.87–5.11)
RDW: 12.4 % (ref 11.5–15.5)
WBC: 8 10*3/uL (ref 4.0–10.5)
nRBC: 0 % (ref 0.0–0.2)

## 2022-11-10 LAB — COMPREHENSIVE METABOLIC PANEL
ALT: 15 U/L (ref 0–44)
AST: 18 U/L (ref 15–41)
Albumin: 4.2 g/dL (ref 3.5–5.0)
Alkaline Phosphatase: 99 U/L (ref 38–126)
Anion gap: 8 (ref 5–15)
BUN: 39 mg/dL — ABNORMAL HIGH (ref 8–23)
CO2: 24 mmol/L (ref 22–32)
Calcium: 9.5 mg/dL (ref 8.9–10.3)
Chloride: 106 mmol/L (ref 98–111)
Creatinine, Ser: 1.33 mg/dL — ABNORMAL HIGH (ref 0.44–1.00)
GFR, Estimated: 40 mL/min — ABNORMAL LOW (ref >60)
Glucose, Bld: 104 mg/dL — ABNORMAL HIGH (ref 70–99)
Potassium: 5.4 mmol/L — ABNORMAL HIGH (ref 3.5–5.1)
Sodium: 138 mmol/L (ref 135–145)
Total Bilirubin: 0.9 mg/dL (ref 0.3–1.2)
Total Protein: 7.7 g/dL (ref 6.5–8.1)

## 2022-11-10 LAB — VITAMIN D 25 HYDROXY (VIT D DEFICIENCY, FRACTURES): Vit D, 25-Hydroxy: 72.08 ng/mL (ref 30–100)

## 2022-11-10 NOTE — Patient Instructions (Addendum)
[place] at [facility] Discharge Instructions   You were seen and examined today by Dr. Delton Coombes. He is a doctor who specializes in the treatment of different types of cancer. You are seeing him today regarding the lump that was found in your left breast.   We will plan to do a CT scan to make sure the cancer from your breast has not spread to other parts of your body. We will obtain blood work on the same day as the CT scan.   See Dr. Arnoldo Morale as scheduled.   We will see you back in the clinic after the CT scan to review those results.      Thank you for choosing [place] at [facility] to provide your oncology and hematology care.  To afford each patient quality time with our provider, please arrive at least 15 minutes before your scheduled appointment time.   If you have a lab appointment with the [place] please come in thru the Main Entrance and check in at the main information desk.  You need to re-schedule your appointment should you arrive 10 or more minutes late.  We strive to give you quality time with our providers, and arriving late affects you and other patients whose appointments are after yours.  Also, if you no show three or more times for appointments you may be dismissed from the clinic at the providers discretion.     Again, thank you for choosing [facility].  Our hope is that these requests will decrease the amount of time that you wait before being seen by our physicians.       _____________________________________________________________  Should you have questions after your visit to [facility], please contact our office at [phone] and follow the prompts.  Our office hours are 8:00 a.m. and 4:30 p.m. Monday - Friday.  Please note that voicemails left after 4:00 p.m. may not be returned until the following business day.  We are closed weekends and major holidays.  You do have access  to a nurse 24-7, just call the main number to the clinic [phone] and do not press any options, hold on the line and a nurse will answer the phone.    For prescription refill requests, have your pharmacy contact our office and allow 72 hours.    Due to Covid, you will need to wear a mask upon entering the hospital. If you do not have a mask, a mask will be given to you at the Main Entrance upon arrival. For doctor visits, patients may have 1 support person age 51 or older with them. For treatment visits, patients can not have anyone with them due to social distancing guidelines and our immunocompromised population.

## 2022-11-10 NOTE — Progress Notes (Signed)
[facility] [address], [place] [address]   Clinic Day:  11/10/2022  Referring physician: Denyce Robert, FNP  Patient Care Team: Denyce Robert, FNP as PCP - General (Family Medicine)   ASSESSMENT & PLAN:   Assessment:  1.  Stage II (T2 N0 G2, ER/PR+, HER2-) left breast UOQ IDC: - She noticed left breast mass in the first week of March. - Mammogram (10/21/2022): Irregular hypoechoic mass in the left breast at 12:30 position 4 CFN measuring 3.1 x 2.1 x 3 cm.  Mass extends to the level of skin.  There is a subtle shadowing mass in the left breast at the 12:30 position 7 CFN measuring 0.4 x 0.7 x 0.8 cm.  No axillary adenopathy. - Biopsy (11/02/2022): Grade 2 IDC, ER 70% moderate strong, PR 0%, HER2 1+, Ki-67 20%.  2.  Left breast DCIS: - Biopsy (11/02/2022): DCIS, ER 90% strong staining, PR 0%  3.  Social/family history: - She is widowed 9 years ago and lives with her youngest son at home.  She does not drive but is independent of ADLs and some IADLs.  She worked on her tobacco form prior to retirement.  Current active smoker, 1 pack/day, started smoking at age 32. - Sister had breast cancer in her 14s.  Another sister had basal cell skin cancer.  Plan:  1.  Stage II (T2 N0 G2, ER/PR+, HER2-) left breast UOQ IDC: - We have reviewed imaging and biopsy results in detail. - IDC and DCIS mass, calcifications and distortion measure up to 6 cm in the left breast. - Recommend CT CAP with contrast. - She has an appointment to see Dr. Arnoldo Morale on 11/23/2022.  She is leaning towards mastectomy when we discussed both options.  2.  Osteoporosis: - DEXA scan (09/15/2022): T-score -3.0 consistent with osteoporosis. - Will discuss about denosumab/Zometa after surgery. - Will check vitamin D level.   Orders Placed This Encounter  Procedures   CT CHEST ABDOMEN PELVIS W CONTRAST    Standing Status:   Future    Standing Expiration Date:   11/10/2023    Order Specific  Question:   If indicated for the ordered procedure, I authorize the administration of contrast media per Radiology protocol    Answer:   Yes    Order Specific Question:   Does the patient have a contrast media/X-ray dye allergy?    Answer:   No    Order Specific Question:   Preferred imaging location?    Answer:   [facility]    Order Specific Question:   Release to patient    Answer:   Immediate    Order Specific Question:   Is Oral Contrast requested for this exam?    Answer:   Yes, Per Radiology protocol   CBC with Differential    Standing Status:   Future    Number of Occurrences:   1    Standing Expiration Date:   11/10/2023   Comprehensive metabolic panel    Standing Status:   Future    Number of Occurrences:   1    Standing Expiration Date:   11/10/2023   VITAMIN D 25 Hydroxy (Vit-D Deficiency, Fractures)    Standing Status:   Future    Number of Occurrences:   1    Standing Expiration Date:   11/10/2023    I,Alexis Herring,acting as a scribe for Derek Jack, MD.,have documented all relevant documentation on the behalf of Derek Jack, MD,as  directed by  Derek Jack, MD while in the presence of Derek Jack, MD.  I, Derek Jack MD, have reviewed the above documentation for accuracy and completeness, and I agree with the above.   Derek Jack, MD   3/27/20245:02 PM  CHIEF COMPLAINT/PURPOSE OF CONSULT:   Diagnosis: left breast carcinoma   Cancer Staging  Breast cancer of upper-outer quadrant of left female breast [facility] Staging form: Breast, AJCC 8th Edition - Clinical stage from 11/10/2022: Stage IIA (cT2, cN0, cM0, G2, ER+, PR-, HER2-) - Unsigned  Ductal carcinoma in situ (DCIS) of left breast Staging form: Breast, AJCC 8th Edition - Clinical stage from 11/10/2022: Stage 0 (cTis (DCIS), cN0, cM0, ER+, PR-, HER2: Not Assessed) - Unsigned   Prior Therapy: none  Current Therapy:  under workup  HISTORY OF PRESENT  ILLNESS:   Oncology History   No history exists.     Maria Hardy is a 82 y.o. female presenting to clinic today for evaluation of left breast carcinoma at the request of NP Denyce Robert. She is accompanied by her sister for today's visit.  Patient had a bilateral diagnostic mammogram on 10/21/22 after noticing a palpable left breast lump. The mammogram showed 2 masses and calcifications of the left breast prompting an [place] and biopsy. Biopsy from 11/02/22 confirmed ductal carcinoma in situ. She is scheduled to see gen surg Dr. Aviva Signs on 11/23/22 for consult.  She states that she noticed the breast lump at the beginning of this month. She denies any associated pain.  Her appetite level is at 100%. Her energy level is at 75%. She reports mild numbness/tingling in her right hand.  She denies any headaches or vision changes. She denies any unintentional weight loss in the last 6 months. She denies any new pains.  She states that she had a sister with breast cancer (diagnosed in her 58s) and another sister with basal cell carcinoma. She denies any other family history of cancer. She has smoked 1ppd since 82 years old.  She reports menarche at age 53 and menopause at age 83. She had 4 children. 1 of her sons passed away at 61 years old from leukemia. She denies any previous use of HRTs or birth control pills.  She denies any history of heart attacks, strokes, or blood clots.  Patient reports that she is able to perform most of her ADLs without assistance but has some limitations secondary to scoliosis, arthritis, and chronic back pain. She does not drive. She presents today in a wheelchair, stating that she cannot walk long distances due to her back pain.  She previously owned a tobacco farm. She lives at home with her youngest son. Her husband passed away 9 years ago.  PAST MEDICAL HISTORY:   Past Medical History: Past Medical History:  Diagnosis Date   Arthritis    Ductal carcinoma in  situ (DCIS) of breast    left breast, diagnosed 10/2022   Hypertension    Osteoporosis    Scoliosis    Vertigo     Surgical History: Past Surgical History:  Procedure Laterality Date   BREAST BIOPSY Left 11/02/2022   [place] LT BREAST BX W LOC DEV 1ST LESION IMG BX SPEC US GUIDE 11/02/2022 [facility]-ULTRASOUND   BREAST BIOPSY Left 11/02/2022   [place] LT BREAST BX W LOC DEV EA ADD LESION IMG BX SPEC US GUIDE 11/02/2022 [facility]-ULTRASOUND   thyroid tumopr remo N/A    THYROIDECTOMY      Social History: Social History   Socioeconomic  History   Marital status: Widowed    Spouse name: Not on file   Number of children: Not on file   Years of education: Not on file   Highest education level: Not on file  Occupational History   Occupation: owned tobacco farm  Tobacco Use   Smoking status: Every Day    Packs/day: 1.00    Years: 58.00    Additional pack years: 0.00    Total pack years: 58.00    Types: Cigarettes   Smokeless tobacco: Never  Vaping Use   Vaping Use: Never used  Substance and Sexual Activity   Alcohol use: No   Drug use: No   Sexual activity: Not on file  Other Topics Concern   Not on file  Social History Narrative   Not on file   Social Determinants of Health   Financial Resource Strain: Not on file  Food Insecurity: No Food Insecurity (11/10/2022)   Hunger Vital Sign    Worried About Running Out of Food in the Last Year: Never true    Ran Out of Food in the Last Year: Never true  Transportation Needs: No Transportation Needs (11/10/2022)   PRAPARE - Hydrologist (Medical): No    Lack of Transportation (Non-Medical): No  Physical Activity: Not on file  Stress: Not on file  Social Connections: Not on file  Intimate Partner Violence: Not At Risk (11/10/2022)   Humiliation, Afraid, Rape, and Kick questionnaire    Fear of Current or Ex-Partner: No    Emotionally Abused: No    Physically Abused: No    Sexually Abused: No    Family History: Family  History  Problem Relation Age of Onset   Breast cancer Sister 63 - 11   Basal cell carcinoma Sister    Leukemia Son 3    Current Medications:  Current Outpatient Medications:    amLODipine (NORVASC) 10 MG tablet, Take 10 mg by mouth daily., Disp: , Rfl:    diclofenac (VOLTAREN) 75 MG EC tablet, Take 1 tablet (75 mg total) by mouth 2 (two) times daily with a meal., Disp: 60 tablet, Rfl: 2   dicyclomine (BENTYL) 20 MG tablet, Take 10 mg by mouth 3 (three) times daily., Disp: , Rfl:    gabapentin (NEURONTIN) 100 MG capsule, Take 100 mg by mouth 2 (two) times daily., Disp: , Rfl:    hydrOXYzine (ATARAX) 25 MG tablet, Take 25 mg by mouth 3 (three) times daily as needed., Disp: , Rfl:    losartan (COZAAR) 100 MG tablet, Take 100 mg by mouth daily., Disp: , Rfl:    meclizine (ANTIVERT) 25 MG tablet, Take 1 tablet (25 mg total) by mouth 4 (four) times daily., Disp: 28 tablet, Rfl: 0   metoprolol tartrate (LOPRESSOR) 25 MG tablet, Take 25 mg by mouth 2 (two) times daily., Disp: , Rfl:    ondansetron (ZOFRAN ODT) 4 MG disintegrating tablet, Take 1 tablet (4 mg total) by mouth every 8 (eight) hours as needed., Disp: 10 tablet, Rfl: 1   tiZANidine (ZANAFLEX) 4 MG tablet, Take 1 tablet (4 mg total) by mouth every 6 (six) hours as needed for muscle spasms., Disp: 30 tablet, Rfl: 0   Magnesium Oxide (MAG-OX PO), Take 250 mg by mouth daily., Disp: , Rfl:    Allergies: No Known Allergies  REVIEW OF SYSTEMS:   Review of Systems  Constitutional:  Negative for chills, fatigue and fever.  HENT:   Negative for lump/mass, mouth sores,  nosebleeds, sore throat and trouble swallowing.   Eyes:  Negative for eye problems.  Respiratory:  Negative for cough and shortness of breath.   Cardiovascular:  Negative for chest pain, leg swelling and palpitations.  Gastrointestinal:  Negative for abdominal pain, constipation, diarrhea, nausea and vomiting.  Genitourinary:  Negative for bladder incontinence, difficulty  urinating, dysuria, frequency, hematuria and nocturia.        + leakage  Musculoskeletal:  Positive for arthralgias (hip pain, arthritis) and back pain (chronic, scoliosis). Negative for flank pain, myalgias and neck pain.  Skin:  Negative for itching and rash.  Neurological:  Positive for numbness (tingling in right hand). Negative for dizziness and headaches.  Hematological:  Does not bruise/bleed easily.  Psychiatric/Behavioral:  Positive for sleep disturbance. Negative for depression and suicidal ideas. The patient is not nervous/anxious.   All other systems reviewed and are negative.    VITALS:   Blood pressure (!) 158/96, pulse 60, temperature 98.4 F (36.9 C), temperature source Oral, resp. rate 18, height 5\' 6"  (1.676 m), weight 169 lb 1.6 oz (76.7 kg), SpO2 99 %.  Wt Readings from Last 3 Encounters:  11/10/22 169 lb 1.6 oz (76.7 kg)  06/03/22 154 lb (69.9 kg)  06/23/20 154 lb (69.9 kg)    Body mass index is 27.29 kg/m.  Performance status (ECOG): 1 - Symptomatic but completely ambulatory  PHYSICAL EXAM:   Physical Exam Vitals and nursing note reviewed. Exam conducted with a chaperone present.  Constitutional:      Appearance: Normal appearance.  Cardiovascular:     Rate and Rhythm: Normal rate and regular rhythm.     Pulses: Normal pulses.     Heart sounds: Normal heart sounds.  Pulmonary:     Effort: Pulmonary effort is normal.     Breath sounds: Normal breath sounds.  Chest:  Breasts:    Left: Mass present. No tenderness.     Comments: 3cm mass left breast between 12 and 1 o'clock No masses in right breast No lymphadenopathy  Abdominal:     Palpations: Abdomen is soft. There is no hepatomegaly, splenomegaly or mass.     Tenderness: There is no abdominal tenderness.  Musculoskeletal:     Right lower leg: No edema.     Left lower leg: No edema.  Lymphadenopathy:     Cervical: No cervical adenopathy.     Right cervical: No superficial, deep or posterior  cervical adenopathy.    Left cervical: No superficial, deep or posterior cervical adenopathy.     Upper Body:     Right upper body: No supraclavicular or axillary adenopathy.     Left upper body: No supraclavicular or axillary adenopathy.  Neurological:     General: No focal deficit present.     Mental Status: She is alert and oriented to person, place, and time.  Psychiatric:        Mood and Affect: Mood normal.        Behavior: Behavior normal.   Breast Exam Chaperone: Anastasio Champion, RN  LABS:      Latest Ref Rng & Units 11/10/2022    9:08 AM 06/23/2020    8:37 AM 12/27/2014   11:22 AM  CBC  WBC 4.0 - 10.5 K/uL 8.0  11.3    Hemoglobin 12.0 - 15.0 g/dL 12.8  12.0  16.0   Hematocrit 36.0 - 46.0 % 39.2  36.8  47.0   Platelets 150 - 400 K/uL 268  316  Latest Ref Rng & Units 11/10/2022    9:08 AM 06/23/2020    8:37 AM 12/27/2014   11:22 AM  CMP  Glucose 70 - 99 mg/dL 104  116  106   BUN 8 - 23 mg/dL 39  22  21   Creatinine 0.44 - 1.00 mg/dL 1.33  0.83  0.70   Sodium 135 - 145 mmol/L 138  137  141   Potassium 3.5 - 5.1 mmol/L 5.4  4.0  4.0   Chloride 98 - 111 mmol/L 106  105  104   CO2 22 - 32 mmol/L 24  24    Calcium 8.9 - 10.3 mg/dL 9.5  8.7    Total Protein 6.5 - 8.1 g/dL 7.7  6.0    Total Bilirubin 0.3 - 1.2 mg/dL 0.9  0.9    Alkaline Phos 38 - 126 U/L 99  57    AST 15 - 41 U/L 18  14    ALT 0 - 44 U/L 15  7     No results found for: "CEA1", "CEA" / No results found for: "CEA1", "CEA" No results found for: "PSA1" No results found for: "[id number]" No results found for: "CAN125"  No results found for: "TOTALPROTELP", "ALBUMINELP", "A1GS", "A2GS", "BETS", "BETA2SER", "GAMS", "MSPIKE", "SPEI" No results found for: "TIBC", "FERRITIN", "IRONPCTSAT" No results found for: "LDH"  Pathology 11/02/22: FINAL MICROSCOPIC DIAGNOSIS: A. LEFT BREAST, MASS @ 12:30, 4 CMFN, NEEDLE CORE BIOPSY: Invasive moderately differentiated ductal adenocarcinoma, grade 2 (3+2+1) Tubule  formation: Score 3 Nuclear pleomorphism: Score 2 Mitotic count: Score 1 Total score: 6 Overall grade: Grade 2 (6/9) Negative for lymphovascular invasion Extensive dense hyalinized peritumoral stromal fibrosis Microcalcifications present within peritumoral stroma Tumor measures 12 mm in greatest linear extent B. LEFT BREAST, MASS @ 12:30, 7 CMFN, NEEDLE CORE BIOPSY: Ductal carcinoma in situ, intermediate nuclear grade, solid type with focal necrosis and cancerization of the lobules Negative for invasive carcinoma Microcalcifications present within DCIS Extensive stromal fibrosis with focal adenosis and early fibromatoid change COMMENT: Immunohistochemical stains for the breast prognostic markers have been ordered, and these results will be issued within an addendum to this report.   STUDIES:   Bone density scan 09/15/22: IMPRESSION: Your patient Maria Hardy completed a BMD test on 09/15/2022 using the [employer] (software version: 14.10) manufactured by [employer]. The following summarizes the results of our evaluation. Technologist: [facility] PATIENT BIOGRAPHICAL: Name: Merelin, Lakins Patient ID: [id number] Birth Date: 1941-03-29 Height: 66.0 in. Gender: Female Exam Date: 09/15/2022 Weight: 154.0 lbs. Indications: Advanced Age, Caucasian, Follow up Osteopenia, Height Loss, Post Menopausal, Tobacco User Fractures: Treatments: Calcium, Multivitamin, Vitamin D DENSITOMETRY RESULTS: Site         Region     Measured Date Measured Age WHO Classification Young Adult T-score BMD         %Change vs. Previous Significant Change (*) AP Spine L1-L2 8/15/2021 82.8 Oste[age] -2.2 0.900 g/cm2 -9.4% Yes AP Spine L1-L2 01/15/2016 76.1 Oste[age] -1.4 0.993 g/cm2 - -   Left Forearm Radius 33% 09/15/2021 82.8 Osteoporosis -3.0 0.502 g/cm2 2.5% - Left Forearm Radius 33% 01/15/2016 76.1 Osteoporosis -3.1 0.491 g/cm2 - - ASSESSMENT: The BMD measured at Forearm  Radius 33% is 0.502 g/cm2 with a T-score of -3.0. This patient is considered osteoporotic according to [facility]) criteria. The scan quality is good. Compared with the prior study on 01/15/16, the BMD of the lumbar spine shows a statistically significant decrease. L3 and L4  were excluded due to advanced degenerative changes. Bilataral hips were excluded due to advanced degenerative changes.  [place] LT BREAST BX W LOC DEV 1ST LESION IMG BX SPEC US GUIDE  Addendum Date: 11/09/2022   ADDENDUM REPORT: 11/09/2022 12:48 ADDENDUM: PATHOLOGY revealed: Site A. LEFT BREAST, MASS @ 12:30, 4 CMFN, NEEDLE CORE BIOPSY: Invasive moderately differentiated ductal adenocarcinoma. Overall grade: Grade 2. Negative for lymphovascular invasion. Extensive dense hyalinized peritumoral stromal fibrosis. Microcalcifications present within peritumoral stroma. Tumor measures 12 mm in greatest linear extent. Pathology results are CONCORDANT with imaging findings, per Dr. Everlean Alstrom. PATHOLOGY revealed: Site B. LEFT BREAST, MASS @ 12:30, 7 CMFN, NEEDLE CORE BIOPSY: Ductal carcinoma in situ, intermediate nuclear grade, solid type with focal necrosis and cancerization of the lobules. Negative for invasive carcinoma. Microcalcifications present within DCIS. Extensive stromal fibrosis with focal adenosis and early fibromatoid change. Pathology results are CONCORDANT with imaging findings, per Dr. Everlean Alstrom. Pathology results and recommendations below were discussed with patient by telephone on 11/03/2022. Patient reported biopsy site within normal limits with slight tenderness at the site. Post biopsy care instructions were reviewed, questions were answered and my direct phone number was provided to patient. Patient was instructed to call [facility] Mammography Department if any concerns or questions arise related to the biopsy. RECOMMENDATION: Surgical and oncological consultation. Request for  surgical and oncological consultation was relayed to Kathi Der RT at [facility] Mammography Department by Electa Sniff RN on 11/03/2022. Pathology results reported by Electa Sniff RN on 11/05/2022. Electronically Signed   By: Franki Cabot M.D.   On: 11/09/2022 12:48   Result Date: 11/09/2022 CLINICAL DATA:  Patient presents for ultrasound-guided core biopsy of a 3.1 cm mass in the left breast at the 12:30 position 4 cm from nipple and ultrasound-guided core biopsy of a subtle 0.8 cm mass with possible associated calcifications in the left breast at the 12:30 position 7 cm from nipple. EXAM: ULTRASOUND GUIDED LEFT BREAST CORE NEEDLE BIOPSY COMPARISON:  Previous exam(s). PROCEDURE: I met with the patient and we discussed the procedure of ultrasound-guided biopsy, including benefits and alternatives. We discussed the high likelihood of a successful procedure. We discussed the risks of the procedure, including infection, bleeding, tissue injury, clip migration, and inadequate sampling. Informed written consent was given. The usual time-out protocol was performed immediately prior to the procedure. SITE 1: LEFT BREAST 12:30 4 CMFN 3.1 CM MASS: Lesion quadrant: UPPER OUTER Using sterile technique and 1% Lidocaine as local anesthetic, under direct ultrasound visualization, a 14 gauge spring-loaded device was used to perform biopsy of the mass in the left breast at the 12:30 position 4 cm from nipple using a medial to lateral approach. At the conclusion of the procedure a ribbon shaped tissue marker clip was deployed into the biopsy cavity. Follow up 2 view mammogram was performed and dictated separately. SITE 2: LEFT BREAST 12:30 7 CMFN 0.8 CM MASS WITH POSSIBLE CALCIFICATIONS: lesion quadrant: UPPER OUTER Using sterile technique and 1% Lidocaine as local anesthetic, under direct ultrasound visualization, a 14 gauge spring-loaded device was used to perform biopsy of subtle/questionable shadowing mass with  calcifications in the left breast at the 12:30 position 7 cm from nipple using a medial to lateral approach. At the conclusion of the procedure a wing shaped tissue marker clip was deployed into the biopsy cavity. Follow up 2 view mammogram was performed and dictated separately. IMPRESSION: 1. Ultrasound-guided core biopsy of the mass in the left breast at  the 12:30 position 4 cm from nipple, at site of ribbon shaped biopsy marking clip. 2. Ultrasound-guided core biopsy of the subtle mass with possible calcifications in the left breast at the 12:30 position 7 cm from nipple, at site of wing shaped biopsy marking clip. Electronically Signed: By: Everlean Alstrom M.D. On: 11/02/2022 08:58   [place] LT BREAST BX W LOC DEV EA ADD LESION IMG BX SPEC US GUIDE  Addendum Date: 11/09/2022   ADDENDUM REPORT: 11/09/2022 12:48 ADDENDUM: PATHOLOGY revealed: Site A. LEFT BREAST, MASS @ 12:30, 4 CMFN, NEEDLE CORE BIOPSY: Invasive moderately differentiated ductal adenocarcinoma. Overall grade: Grade 2. Negative for lymphovascular invasion. Extensive dense hyalinized peritumoral stromal fibrosis. Microcalcifications present within peritumoral stroma. Tumor measures 12 mm in greatest linear extent. Pathology results are CONCORDANT with imaging findings, per Dr. Everlean Alstrom. PATHOLOGY revealed: Site B. LEFT BREAST, MASS @ 12:30, 7 CMFN, NEEDLE CORE BIOPSY: Ductal carcinoma in situ, intermediate nuclear grade, solid type with focal necrosis and cancerization of the lobules. Negative for invasive carcinoma. Microcalcifications present within DCIS. Extensive stromal fibrosis with focal adenosis and early fibromatoid change. Pathology results are CONCORDANT with imaging findings, per Dr. Everlean Alstrom. Pathology results and recommendations below were discussed with patient by telephone on 11/03/2022. Patient reported biopsy site within normal limits with slight tenderness at the site. Post biopsy care instructions were reviewed,  questions were answered and my direct phone number was provided to patient. Patient was instructed to call [facility] Mammography Department if any concerns or questions arise related to the biopsy. RECOMMENDATION: Surgical and oncological consultation. Request for surgical and oncological consultation was relayed to Kathi Der RT at [facility] Mammography Department by Electa Sniff RN on 11/03/2022. Pathology results reported by Electa Sniff RN on 11/05/2022. Electronically Signed   By: Franki Cabot M.D.   On: 11/09/2022 12:48   Result Date: 11/09/2022 CLINICAL DATA:  Patient presents for ultrasound-guided core biopsy of a 3.1 cm mass in the left breast at the 12:30 position 4 cm from nipple and ultrasound-guided core biopsy of a subtle 0.8 cm mass with possible associated calcifications in the left breast at the 12:30 position 7 cm from nipple. EXAM: ULTRASOUND GUIDED LEFT BREAST CORE NEEDLE BIOPSY COMPARISON:  Previous exam(s). PROCEDURE: I met with the patient and we discussed the procedure of ultrasound-guided biopsy, including benefits and alternatives. We discussed the high likelihood of a successful procedure. We discussed the risks of the procedure, including infection, bleeding, tissue injury, clip migration, and inadequate sampling. Informed written consent was given. The usual time-out protocol was performed immediately prior to the procedure. SITE 1: LEFT BREAST 12:30 4 CMFN 3.1 CM MASS: Lesion quadrant: UPPER OUTER Using sterile technique and 1% Lidocaine as local anesthetic, under direct ultrasound visualization, a 14 gauge spring-loaded device was used to perform biopsy of the mass in the left breast at the 12:30 position 4 cm from nipple using a medial to lateral approach. At the conclusion of the procedure a ribbon shaped tissue marker clip was deployed into the biopsy cavity. Follow up 2 view mammogram was performed and dictated separately. SITE 2: LEFT BREAST  12:30 7 CMFN 0.8 CM MASS WITH POSSIBLE CALCIFICATIONS: lesion quadrant: UPPER OUTER Using sterile technique and 1% Lidocaine as local anesthetic, under direct ultrasound visualization, a 14 gauge spring-loaded device was used to perform biopsy of subtle/questionable shadowing mass with calcifications in the left breast at the 12:30 position 7 cm from nipple using a medial to lateral  approach. At the conclusion of the procedure a wing shaped tissue marker clip was deployed into the biopsy cavity. Follow up 2 view mammogram was performed and dictated separately. IMPRESSION: 1. Ultrasound-guided core biopsy of the mass in the left breast at the 12:30 position 4 cm from nipple, at site of ribbon shaped biopsy marking clip. 2. Ultrasound-guided core biopsy of the subtle mass with possible calcifications in the left breast at the 12:30 position 7 cm from nipple, at site of wing shaped biopsy marking clip. Electronically Signed: By: Everlean Alstrom M.D. On: 11/02/2022 08:58  MM CLIP PLACEMENT LEFT  Result Date: 11/02/2022 CLINICAL DATA:  Post ultrasound-guided core biopsy of a 3.1 cm mass in the left breast at the 12:30 position 4 cm from nipple and ultrasound-guided core biopsy of a subtle 0.8 cm mass with calcifications in the left breast at the 12:30 position 7 cm from nipple. EXAM: 3D DIAGNOSTIC LEFT MAMMOGRAM POST ULTRASOUND BIOPSY COMPARISON:  Previous exam(s). FINDINGS: 3D Mammographic images were obtained following ultrasound-guided core biopsy of a 3.1 cm mass in the left breast at the 12:30 position 4 cm from nipple and ultrasound-guided core biopsy of a subtle 0.8 cm mass with calcifications in the left breast at the 12:30 position 7 cm from nipple. The ribbon shaped is present at the site of the biopsied mass in the left breast the 12:30 position 4 cm from nipple. The wing shaped biopsy marking clip is present at the site of the biopsied subtle mass with associated calcifications in the left breast at the  12:30 position 7 cm from nipple. IMPRESSION: 1. Ribbon shaped biopsy marking clip at site of biopsied mass in the left breast at the 12:30 position 4 cm from nipple. 2. Wing shaped biopsy marking clip site of biopsied subtle mass with associated calcifications in the left breast at the 12:30 position 7 cm from nipple. Final Assessment: Post Procedure Mammograms for Marker Placement Electronically Signed   By: Everlean Alstrom M.D.   On: 11/02/2022 09:04  MM DIAG BREAST TOMO BILATERAL  Result Date: 10/21/2022 CLINICAL DATA:  82 year old female with a palpable area of concern in the left breast. EXAM: DIGITAL DIAGNOSTIC BILATERAL MAMMOGRAM WITH TOMOSYNTHESIS; ULTRASOUND LEFT BREAST LIMITED TECHNIQUE: Bilateral digital diagnostic mammography and breast tomosynthesis was performed.; Targeted ultrasound examination of the left breast was performed. COMPARISON:  Previous exam(s). ACR Breast Density Category c: The breasts are heterogeneously dense, which may obscure small masses. FINDINGS: There is a spiculated mass corresponding to the area of palpable concern in upper-outer left breast with small adjacent areas of nodularity measuring up to 3.6 cm. There are fine branching calcifications extending posterior to the dominant mass spanning approximately 2 cm with a discrete area of subtle distortion. The dominant mass with associated calcifications and distortion all together measure 6 cm. No suspicious masses or calcifications seen in the right breast. An initially questioned asymmetry seen in the right breast resolves on the additional spot compression tomograms compatible with an area of overlapping fibroglandular tissue. Physical examination reveals a firm mass in the upper slightly outer left breast. Targeted ultrasound of the left breast was performed. There is an irregular hypoechoic mass in the left breast at the 12:30 position 4 cm from nipple measuring 3.1 x 2.1 x 3 cm. This mass extends to the level skin.  This corresponds with the mass seen in the left breast at mammography. There is a subtle shadowing mass in the left breast at the 12:30 position 7 cm from nipple  measuring 0.4 x 0.7 x 0.8 cm. This is felt to correspond the calcifications and associated distortion seen posterior to the dominant mass at mammography. No lymphadenopathy seen in the left axilla. IMPRESSION: 1. Suspicious palpable 3.1 cm mass in the left breast at the 12:30 4 cm from nipple position. 2. Suspicious shadowing mass in the left breast at the 12:30 position 7 cm from nipple felt to correspond with the calcifications distortion seen posterolateral to the dominant mass. 3. Altogether mass, calcifications and distortion measure up to 6 cm in the left breast. RECOMMENDATION: 1. Recommend ultrasound-guided core biopsy of the mass in the left breast at the 12:30 position 4 cm from nipple. 2. Recommend ultrasound-guided core biopsy of the subtle shadowing mass in the left breast at the 12:30 position 7 cm from nipple. If this mass cannot be reproduced on the day the patient returns for biopsy, then recommend she be scheduled at the breast Center for stereotactic guided biopsy of the calcifications with associated distortion. I have discussed the findings and recommendations with the patient. If applicable, a reminder letter will be sent to the patient regarding the next appointment. BI-RADS CATEGORY  5: Highly suggestive of malignancy. Electronically Signed   By: Everlean Alstrom M.D.   On: 10/21/2022 15:39  US BREAST LTD UNI LEFT INC AXILLA  Result Date: 10/21/2022 CLINICAL DATA:  82 year old female with a palpable area of concern in the left breast. EXAM: DIGITAL DIAGNOSTIC BILATERAL MAMMOGRAM WITH TOMOSYNTHESIS; ULTRASOUND LEFT BREAST LIMITED TECHNIQUE: Bilateral digital diagnostic mammography and breast tomosynthesis was performed.; Targeted ultrasound examination of the left breast was performed. COMPARISON:  Previous exam(s). ACR Breast  Density Category c: The breasts are heterogeneously dense, which may obscure small masses. FINDINGS: There is a spiculated mass corresponding to the area of palpable concern in upper-outer left breast with small adjacent areas of nodularity measuring up to 3.6 cm. There are fine branching calcifications extending posterior to the dominant mass spanning approximately 2 cm with a discrete area of subtle distortion. The dominant mass with associated calcifications and distortion all together measure 6 cm. No suspicious masses or calcifications seen in the right breast. An initially questioned asymmetry seen in the right breast resolves on the additional spot compression tomograms compatible with an area of overlapping fibroglandular tissue. Physical examination reveals a firm mass in the upper slightly outer left breast. Targeted ultrasound of the left breast was performed. There is an irregular hypoechoic mass in the left breast at the 12:30 position 4 cm from nipple measuring 3.1 x 2.1 x 3 cm. This mass extends to the level skin. This corresponds with the mass seen in the left breast at mammography. There is a subtle shadowing mass in the left breast at the 12:30 position 7 cm from nipple measuring 0.4 x 0.7 x 0.8 cm. This is felt to correspond the calcifications and associated distortion seen posterior to the dominant mass at mammography. No lymphadenopathy seen in the left axilla. IMPRESSION: 1. Suspicious palpable 3.1 cm mass in the left breast at the 12:30 4 cm from nipple position. 2. Suspicious shadowing mass in the left breast at the 12:30 position 7 cm from nipple felt to correspond with the calcifications distortion seen posterolateral to the dominant mass. 3. Altogether mass, calcifications and distortion measure up to 6 cm in the left breast. RECOMMENDATION: 1. Recommend ultrasound-guided core biopsy of the mass in the left breast at the 12:30 position 4 cm from nipple. 2. Recommend ultrasound-guided core  biopsy of the subtle  shadowing mass in the left breast at the 12:30 position 7 cm from nipple. If this mass cannot be reproduced on the day the patient returns for biopsy, then recommend she be scheduled at the breast Center for stereotactic guided biopsy of the calcifications with associated distortion. I have discussed the findings and recommendations with the patient. If applicable, a reminder letter will be sent to the patient regarding the next appointment. BI-RADS CATEGORY  5: Highly suggestive of malignancy. Electronically Signed   By: Everlean Alstrom M.D.   On: 10/21/2022 15:39

## 2022-11-22 ENCOUNTER — Other Ambulatory Visit: Payer: Self-pay | Admitting: *Deleted

## 2022-11-22 DIAGNOSIS — N6320 Unspecified lump in the left breast, unspecified quadrant: Secondary | ICD-10-CM

## 2022-11-23 ENCOUNTER — Ambulatory Visit (INDEPENDENT_AMBULATORY_CARE_PROVIDER_SITE_OTHER): Payer: 59 | Admitting: General Surgery

## 2022-11-23 ENCOUNTER — Encounter: Payer: Self-pay | Admitting: General Surgery

## 2022-11-23 VITALS — BP 189/67 | HR 69 | Temp 98.2°F | Resp 16 | Ht 66.0 in | Wt 172.0 lb

## 2022-11-23 DIAGNOSIS — C50412 Malignant neoplasm of upper-outer quadrant of left female breast: Secondary | ICD-10-CM | POA: Diagnosis not present

## 2022-11-23 DIAGNOSIS — Z170 Estrogen receptor positive status [ER+]: Secondary | ICD-10-CM | POA: Diagnosis not present

## 2022-11-24 NOTE — Progress Notes (Signed)
Maria Hardy; [id number]; 03/19/1941   HPI Patient is an 82-year-old white female who was referred to my care by Tanika McCorkle and Dr. Katragadda of oncology for evaluation and treatment of a left breast cancer.  Patient noticed a left breast mass in early March.  Biopsy of this reveals an infiltrating ductal carcinoma which is ER positive as well as another area in the same upper, outer quadrant closer to the nipple with DCIS.  Patient denies any family history of breast cancer.  She was seen by Dr. Katragadda who evaluated the patient and sent her to me for surgical evaluation.  The mass is approximately 3 and half centimeters in its greatest diameter.  She denies any nipple discharge. 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 breast, diagnosed 10/2022   Hypertension    Osteoporosis    Scoliosis    Vertigo     Past Surgical History:  Procedure Laterality Date   BREAST BIOPSY Left 11/02/2022   US LT BREAST BX W LOC DEV 1ST LESION IMG BX SPEC US GUIDE 11/02/2022 [facility]   BREAST BIOPSY Left 11/02/2022   US LT BREAST BX W LOC DEV EA ADD LESION IMG BX SPEC US GUIDE 11/02/2022 [facility]AS[facility]   thyroid tumopr remo N/A    THYROIDECTOMY      Family History  Problem Relation Age of Onset   Breast cancer Sister 60 - 69   Basal cell carcinoma Sister    Leukemia Son 3    Current Outpatient Medications on File Prior to Visit  Medication Sig Dispense Refill   amLODipine (NORVASC) 10 MG tablet Take 10 mg by mouth daily.     diclofenac (VOLTAREN) 75 MG EC tablet Take 1 tablet (75 mg total) by mouth 2 (two) times daily with a meal. 60 tablet 2   dicyclomine (BENTYL) 20 MG tablet Take 10 mg by mouth 3 (three) times daily.     gabapentin (NEURONTIN) 100 MG capsule Take 100 mg by mouth 2 (two) times daily.     hydrOXYzine (ATARAX) 25 MG tablet Take 25 mg by mouth 3 (three) times daily as needed.     losartan (COZAAR) 100 MG tablet  Take 100 mg by mouth daily.     Magnesium Oxide (MAG-OX PO) Take 250 mg by mouth daily.     meclizine (ANTIVERT) 25 MG tablet Take 1 tablet (25 mg total) by mouth 4 (four) times daily. 28 tablet 0   metoprolol tartrate (LOPRESSOR) 25 MG tablet Take 25 mg by mouth 2 (two) times daily.     ondansetron (ZOFRAN ODT) 4 MG disintegrating tablet Take 1 tablet (4 mg total) by mouth every 8 (eight) hours as needed. 10 tablet 1   tiZANidine (ZANAFLEX) 4 MG tablet Take 1 tablet (4 mg total) by mouth every 6 (six) hours as needed for muscle spasms. 30 tablet 0   No current facility-administered medications on file prior to visit.    No Known Allergies  Social History   Substance and Sexual Activity  Alcohol Use No    Social History   Tobacco Use  Smoking Status Every Day   Packs/day: 1.00   Years: 58.00   Additional pack years: 0.00   Total pack years: 58.00   Types: Cigarettes  Smokeless Tobacco Never    Review of Systems  Constitutional: Negative.   HENT: Negative.    Eyes:  Positive for pain.  Respiratory:   Negative.    Cardiovascular: Negative.   Gastrointestinal: Negative.   Genitourinary:  Positive for frequency.  Musculoskeletal:  Positive for back pain and joint pain.  Skin: Negative.   Neurological: Negative.   Psychiatric/Behavioral: Negative.      Objective   Vitals:   11/23/22 1348  BP: (!) 189/67  Pulse: 69  Resp: 16  Temp: 98.2 F (36.8 C)  SpO2: 97%    Physical Exam Vitals reviewed. Exam conducted with a chaperone present.  Constitutional:      Appearance: Normal appearance. She is normal weight. She is not ill-appearing.  HENT:     Head: Normocephalic and atraumatic.  Cardiovascular:     Rate and Rhythm: Normal rate and regular rhythm.     Heart sounds: Normal heart sounds. No murmur heard.    No friction rub. No gallop.  Pulmonary:     Effort: Pulmonary effort is normal. No respiratory distress.     Breath sounds: Normal breath sounds. No  stridor. No wheezing, rhonchi or rales.  Lymphadenopathy:     Cervical: No cervical adenopathy.  Skin:    General: Skin is warm and dry.  Neurological:     Mental Status: She is alert and oriented to person, place, and time.   Breast: No dominant mass, nipple discharge, or dimpling in right breast.  Axillas negative for palpable nodes.  Left breast examination reveals some nipple retraction with a large palpable dominant mass in the upper, outer quadrant of the left breast.  The axilla is negative for palpable nodes.  Mammography report reviewed.  Pathology report reviewed. 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  Also given her age and the lack of clinically positive axillary lymph nodes, there is no need for further axillary evaluation.  The risks and benefits of the procedure including bleeding, infection, cardiopulmonary difficulties, and the possibility of needing blood transfusion were fully explained to the patient, who gave informed consent.  She does realize she will have a drain in postoperatively.  She will follow-up with Dr. Ellin Saba after the surgery.

## 2022-11-24 NOTE — H&P (Signed)
Maria Hardy [place]; [id number]; 1941-05-10   HPI Patient is an 82 year old white female who was referred to my care by Catarina Hartshorn and Dr. Ellin Saba of oncology for evaluation and treatment of a left breast cancer.  Patient noticed a left breast mass in early March.  Biopsy of this reveals an infiltrating ductal carcinoma which is ER positive as well as another area in the same upper, outer quadrant closer to the nipple with DCIS.  Patient denies any family history of breast cancer.  She was seen by Dr. Ellin Saba who evaluated the patient and sent her to me for surgical evaluation.  The mass is approximately 3 and half centimeters in its greatest diameter.  She denies any nipple discharge. 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 breast, diagnosed 10/2022   Hypertension    Osteoporosis    Scoliosis    Vertigo     Past Surgical History:  Procedure Laterality Date   BREAST BIOPSY Left 11/02/2022   [place] LT BREAST BX W LOC DEV 1ST LESION IMG BX SPEC US GUIDE 11/02/2022 [facility]   BREAST BIOPSY Left 11/02/2022   [place] LT BREAST BX W LOC DEV EA ADD LESION IMG BX SPEC US GUIDE 11/02/2022 [facility]TRAS[facility]   thyroid tumopr remo N/A    THYROIDECTOMY      Family History  Problem Relation Age of Onset   Breast cancer Sister 56 - 48   Basal cell carcinoma Sister    Leukemia Son 3    Current Outpatient Medications on File Prior to Visit  Medication Sig Dispense Refill   amLODipine (NORVASC) 10 MG tablet Take 10 mg by mouth daily.     diclofenac (VOLTAREN) 75 MG EC tablet Take 1 tablet (75 mg total) by mouth 2 (two) times daily with a meal. 60 tablet 2   dicyclomine (BENTYL) 20 MG tablet Take 10 mg by mouth 3 (three) times daily.     gabapentin (NEURONTIN) 100 MG capsule Take 100 mg by mouth 2 (two) times daily.     hydrOXYzine (ATARAX) 25 MG tablet Take 25 mg by mouth 3 (three) times daily as needed.     losartan (COZAAR) 100 MG tablet  Take 100 mg by mouth daily.     Magnesium Oxide (MAG-OX PO) Take 250 mg by mouth daily.     meclizine (ANTIVERT) 25 MG tablet Take 1 tablet (25 mg total) by mouth 4 (four) times daily. 28 tablet 0   metoprolol tartrate (LOPRESSOR) 25 MG tablet Take 25 mg by mouth 2 (two) times daily.     ondansetron (ZOFRAN ODT) 4 MG disintegrating tablet Take 1 tablet (4 mg total) by mouth every 8 (eight) hours as needed. 10 tablet 1   tiZANidine (ZANAFLEX) 4 MG tablet Take 1 tablet (4 mg total) by mouth every 6 (six) hours as needed for muscle spasms. 30 tablet 0   No current facility-administered medications on file prior to visit.    No Known Allergies  Social History   Substance and Sexual Activity  Alcohol Use No    Social History   Tobacco Use  Smoking Status Every Day   Packs/day: 1.00   Years: 58.00   Additional pack years: 0.00   Total pack years: 58.00   Types: Cigarettes  Smokeless Tobacco Never    Review of Systems  Constitutional: Negative.   HENT: Negative.    Eyes:  Positive for pain.  Respiratory:  Negative.    Cardiovascular: Negative.   Gastrointestinal: Negative.   Genitourinary:  Positive for frequency.  Musculoskeletal:  Positive for back pain and joint pain.  Skin: Negative.   Neurological: Negative.   Psychiatric/Behavioral: Negative.      Objective   Vitals:   11/23/22 1348  BP: (!) 189/67  Pulse: 69  Resp: 16  Temp: 98.2 F (36.8 C)  SpO2: 97%    Physical Exam Vitals reviewed. Exam conducted with a chaperone present.  Constitutional:      Appearance: Normal appearance. She is normal weight. She is not ill-appearing.  HENT:     Head: Normocephalic and atraumatic.  Cardiovascular:     Rate and Rhythm: Normal rate and regular rhythm.     Heart sounds: Normal heart sounds. No murmur heard.    No friction rub. No gallop.  Pulmonary:     Effort: Pulmonary effort is normal. No respiratory distress.     Breath sounds: Normal breath sounds. No  stridor. No wheezing, rhonchi or rales.  Lymphadenopathy:     Cervical: No cervical adenopathy.  Skin:    General: Skin is warm and dry.  Neurological:     Mental Status: She is alert and oriented to person, place, and time.   Breast: No dominant mass, nipple discharge, or dimpling in right breast.  Axillas negative for palpable nodes.  Left breast examination reveals some nipple retraction with a large palpable dominant mass in the upper, outer quadrant of the left breast.  The axilla is negative for palpable nodes.  Mammography report reviewed.  Pathology report reviewed. 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  Also given her age and the lack of clinically positive axillary lymph nodes, there is no need for further axillary evaluation.  The risks and benefits of the procedure including bleeding, infection, cardiopulmonary difficulties, and the possibility of needing blood transfusion were fully explained to the patient, who gave informed consent.  She does realize she will have a drain in postoperatively.  She will follow-up with Dr. Ellin Saba after the surgery.

## 2022-11-25 ENCOUNTER — Ambulatory Visit (HOSPITAL_COMMUNITY)
Admission: RE | Admit: 2022-11-25 | Discharge: 2022-11-25 | Disposition: A | Discharge status: Home / Self Care | Payer: 59 | Source: Ambulatory Visit | Attending: Hematology | Admitting: Hematology

## 2022-11-25 DIAGNOSIS — C50412 Malignant neoplasm of upper-outer quadrant of left female breast: Secondary | ICD-10-CM | POA: Diagnosis present

## 2022-11-25 DIAGNOSIS — Z170 Estrogen receptor positive status [ER+]: Secondary | ICD-10-CM | POA: Insufficient documentation

## 2022-11-25 MED ORDER — IOHEXOL 300 MG/ML  SOLN
80.0000 mL | Freq: Once | INTRAMUSCULAR | Status: AC | PRN
  Administered 2022-11-25: 80 mL via INTRAVENOUS

## 2022-11-28 NOTE — Progress Notes (Signed)
[facility] [address] [place] [address]    Clinic Day:  12/01/2022  Referring physician: Marylynn Pearson, FNP  Patient Care Team: Marylynn Pearson, FNP as PCP - General (Family Medicine)   ASSESSMENT & PLAN:   Assessment: 1.  Stage IV (T2 N0 G2, ER/PR+, HER2-) left breast UOQ IDC: - She noticed left breast mass in the first week of March. - Mammogram (10/21/2022): Irregular hypoechoic mass in the left breast at 12:30 position 4 CFN measuring 3.1 x 2.1 x 3 cm.  Mass extends to the level of skin.  There is a subtle shadowing mass in the left breast at the 12:30 position 7 CFN measuring 0.4 x 0.7 x 0.8 cm.  No axillary adenopathy. - Biopsy (11/02/2022): Grade 2 IDC, ER 70% moderate strong, PR 0%, HER2 1+, Ki-67 20%.   2.  Left breast DCIS: - Biopsy (11/02/2022): DCIS, ER 90% strong staining, PR 0%   3.  Social/family history: - She is widowed 9 years ago and lives with her youngest son at home.  She does not drive but is independent of ADLs and some IADLs.  She worked on her tobacco form prior to retirement.  Current active smoker, 1 pack/day, started smoking at age 61. - Sister had breast cancer in her 29s.  Another sister had basal cell skin cancer.    Plan: 1.  Stage IV (T2 N0 G2, ER/PR+, HER2-) left breast UOQ IDC: - I have reviewed CT CAP (11/25/2022): Extensive mediastinal and hilar adenopathy.  Small bilateral lung nodules indeterminate but favored to represent metastasis.  Left breast primary without axillary or subdiaphragmatic metastasis. - Unfortunately she has metastatic disease.  We discussed the prognosis in detail.  Treatment intent is palliative was discussed with the patient and her family. - I have recommended PET scan as a baseline. - I have recommended first-line therapy with anastrozole and CDK 4/6 inhibitor palbociclib.  We will start at low-dose of 75 mg 21 days on/7 days off and titrate up as tolerated.  We discussed side effects including  cytopenias and fatigue. - RTC after the PET scan.   2.  Osteoporosis: - DEXA scan on 09/15/2022: T-score -3 consistent with osteoporosis. - Vitamin D level was normal at 72. - I have also talked to her about initiating Prolia 60 mg every 6 months.  If she is found to have bone metastasis on the PET scan, we will consider denosumab 120 mg monthly.    Orders Placed This Encounter  Procedures   NM PET Image Initial (PI) Skull Base To Thigh    Standing Status:   Future    Standing Expiration Date:   11/29/2023    Order Specific Question:   If indicated for the ordered procedure, I authorize the administration of a radiopharmaceutical per Radiology protocol    Answer:   Yes    Order Specific Question:   Preferred imaging location?    Answer:   [facility]    Order Specific Question:   Release to patient    Answer:   Immediate      I,Katie Daubenspeck,acting as a scribe for Doreatha Massed, MD.,have documented all relevant documentation on the behalf of Doreatha Massed, MD,as directed by  Doreatha Massed, MD while in the presence of Doreatha Massed, MD.   I, Doreatha Massed MD, have reviewed the above documentation for accuracy and completeness, and I agree with the above.   Doreatha Massed, MD   4/17/20248:16 AM  CHIEF COMPLAINT:  Diagnosis: left breast carcinoma    Cancer Staging  Breast cancer of upper-outer quadrant of left female breast Staging form: Breast, AJCC 8th Edition - Clinical stage from 11/10/2022: Stage IV (cT2, cN0, cM1, G2, ER+, PR-, HER2-) - Unsigned  Ductal carcinoma in situ (DCIS) of left breast Staging form: Breast, AJCC 8th Edition - Clinical stage from 11/10/2022: Stage 0 (cTis (DCIS), cN0, cM0, ER+, PR-, HER2: Not Assessed) - Unsigned    Prior Therapy: none  Current Therapy: Anastrozole and Ibrance   HISTORY OF PRESENT ILLNESS:   Oncology History   No history exists.     INTERVAL HISTORY:   Maria Hardy is a 82 y.o. female  presenting to clinic today for follow up of left breast carcinoma. She was last seen by me on 11/10/22 in consultation.  Since her last visit, she underwent staging CT C/A/P on 11/25/22 showing: extensive mediastinal and hilar adenopathy; indeterminate but suspicious small bilateral pulmonary nodules; left breast primary, without axillary or subdiaphragmatic metastasis; 4.1 cm left-sided thyroid mass; central uterine hypoattenuation.  She also met with Dr. Lovell Sheehan on 11/23/22 to discuss surgical treatment options. She is currently scheduled for simple mastectomy on 12/08/22.  Today, she states that she is doing well overall. Her appetite level is at 100%. Her energy level is at 100%.  PAST MEDICAL HISTORY:   Past Medical History: 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 breast, diagnosed 10/2022   Hypertension    Osteoporosis    Scoliosis    Vertigo     Surgical History: Past Surgical History:  Procedure Laterality Date   BREAST BIOPSY Left 11/02/2022   [place] LT BREAST BX W LOC DEV 1ST LESION IMG BX SPEC US GUIDE 11/02/2022 [facility]-ULTRASOUND   BREAST BIOPSY Left 11/02/2022   [place] LT BREAST BX W LOC DEV EA ADD LESION IMG BX SPEC US GUIDE 11/02/2022 [facility]-ULTRASOUND   thyroid tumopr remo N/A    THYROIDECTOMY      Social History: Social History   Socioeconomic History   Marital status: Widowed    Spouse name: Not on file   Number of children: Not on file   Years of education: Not on file   Highest education level: Not on file  Occupational History   Occupation: owned tobacco farm  Tobacco Use   Smoking status: Every Day    Packs/day: 1.00    Years: 58.00    Additional pack years: 0.00    Total pack years: 58.00    Types: Cigarettes   Smokeless tobacco: Never  Vaping Use   Vaping Use: Never used  Substance and Sexual Activity   Alcohol use: No   Drug use: No   Sexual activity: Not on file  Other Topics Concern   Not on  file  Social History Narrative   Not on file   Social Determinants of Health   Financial Resource Strain: Not on file  Food Insecurity: No Food Insecurity (11/10/2022)   Hunger Vital Sign    Worried About Running Out of Food in the Last Year: Never true    Ran Out of Food in the Last Year: Never true  Transportation Needs: No Transportation Needs (11/10/2022)   PRAPARE - Administrator, Civil Service (Medical): No    Lack of Transportation (Non-Medical): No  Physical Activity: Not on file  Stress: Not on file  Social Connections: Not on file  Intimate Partner Violence:  Not At Risk (11/10/2022)   Humiliation, Afraid, Rape, and Kick questionnaire    Fear of Current or Ex-Partner: No    Emotionally Abused: No    Physically Abused: No    Sexually Abused: No    Family History: Family History  Problem Relation Age of Onset   Breast cancer Sister 67 - 75   Basal cell carcinoma Sister    Leukemia Son 3    Current Medications:  Current Outpatient Medications:    amLODipine (NORVASC) 10 MG tablet, Take 10 mg by mouth every evening., Disp: , Rfl:    anastrozole (ARIMIDEX) 1 MG tablet, Take 1 tablet (1 mg total) by mouth daily. (Patient taking differently: Take 1 mg by mouth every evening.), Disp: 30 tablet, Rfl: 6   diclofenac (VOLTAREN) 75 MG EC tablet, Take 1 tablet (75 mg total) by mouth 2 (two) times daily with a meal., Disp: 60 tablet, Rfl: 2   dicyclomine (BENTYL) 10 MG capsule, Take 10 mg by mouth 3 (three) times daily., Disp: , Rfl:    gabapentin (NEURONTIN) 100 MG capsule, Take 100 mg by mouth 2 (two) times daily., Disp: , Rfl:    hydrOXYzine (ATARAX) 25 MG tablet, Take 25 mg by mouth 3 (three) times daily., Disp: , Rfl:    losartan (COZAAR) 100 MG tablet, Take 100 mg by mouth in the morning., Disp: , Rfl:    Magnesium Oxide (MAG-OX PO), Take 1 tablet by mouth in the morning., Disp: , Rfl:    metoprolol tartrate (LOPRESSOR) 25 MG tablet, Take 25 mg by mouth 2 (two)  times daily., Disp: , Rfl:    acetaminophen (TYLENOL) 500 MG tablet, Take 500 mg by mouth every 6 (six) hours as needed (pain.)., Disp: , Rfl:    Ascorbic Acid (VITAMIN C PO), Take 1 tablet by mouth in the morning., Disp: , Rfl:    Cholecalciferol (VITAMIN D-3 PO), Take 1 tablet by mouth in the morning., Disp: , Rfl:    meclizine (ANTIVERT) 25 MG tablet, Take 25 mg by mouth 3 (three) times daily as needed for dizziness., Disp: , Rfl:    Multiple Vitamin (MULTIVITAMIN WITH MINERALS) TABS tablet, Take 1 tablet by mouth in the morning., Disp: , Rfl:    naproxen sodium (ALEVE) 220 MG tablet, Take 220 mg by mouth daily as needed (pain.)., Disp: , Rfl:    Omega-3 Fatty Acids (FISH OIL PO), Take 1 capsule by mouth in the morning., Disp: , Rfl:    ondansetron (ZOFRAN ODT) 4 MG disintegrating tablet, Take 1 tablet (4 mg total) by mouth every 8 (eight) hours as needed. (Patient not taking: Reported on 11/29/2022), Disp: 10 tablet, Rfl: 1   palbociclib (IBRANCE) 75 MG tablet, Take 1 tablet (75 mg total) by mouth daily. Take for 21 days on, 7 days off, repeat every 28 days., Disp: 21 tablet, Rfl: 2   Allergies: No Known Allergies  REVIEW OF SYSTEMS:   Review of Systems  Constitutional:  Negative for chills, fatigue and fever.  HENT:   Negative for lump/mass, mouth sores, nosebleeds, sore throat and trouble swallowing.   Eyes:  Negative for eye problems.  Respiratory:  Positive for cough. Negative for shortness of breath.   Cardiovascular:  Negative for chest pain, leg swelling and palpitations.  Gastrointestinal:  Negative for abdominal pain, constipation, diarrhea, nausea and vomiting.  Genitourinary:  Negative for bladder incontinence, difficulty urinating, dysuria, frequency, hematuria and nocturia.   Musculoskeletal:  Negative for arthralgias, back pain, flank pain, myalgias and  neck pain.  Skin:  Negative for itching and rash.  Neurological:  Positive for headaches. Negative for dizziness and  numbness.  Hematological:  Does not bruise/bleed easily.  Psychiatric/Behavioral:  Negative for depression, sleep disturbance and suicidal ideas. The patient is not nervous/anxious.   All other systems reviewed and are negative.    VITALS:   Blood pressure (!) 148/80, pulse (!) 56, temperature (!) 97.5 F (36.4 C), temperature source Tympanic, resp. rate 20, height 5\' 6"  (1.676 m), weight 171 lb 3.2 oz (77.7 kg), SpO2 100 %.  Wt Readings from Last 3 Encounters:  11/29/22 171 lb 3.2 oz (77.7 kg)  11/23/22 172 lb (78 kg)  11/10/22 169 lb 1.6 oz (76.7 kg)    Body mass index is 27.63 kg/m.  Performance status (ECOG): 1 - Symptomatic but completely ambulatory  PHYSICAL EXAM:   Physical Exam Vitals and nursing note reviewed. Exam conducted with a chaperone present.  Constitutional:      Appearance: Normal appearance.  Cardiovascular:     Rate and Rhythm: Normal rate and regular rhythm.     Pulses: Normal pulses.     Heart sounds: Normal heart sounds.  Pulmonary:     Effort: Pulmonary effort is normal.     Breath sounds: Normal breath sounds.  Abdominal:     Palpations: Abdomen is soft. There is no hepatomegaly, splenomegaly or mass.     Tenderness: There is no abdominal tenderness.  Musculoskeletal:     Right lower leg: No edema.     Left lower leg: No edema.  Lymphadenopathy:     Cervical: No cervical adenopathy.     Right cervical: No superficial, deep or posterior cervical adenopathy.    Left cervical: No superficial, deep or posterior cervical adenopathy.     Upper Body:     Right upper body: No supraclavicular or axillary adenopathy.     Left upper body: No supraclavicular or axillary adenopathy.  Neurological:     General: No focal deficit present.     Mental Status: She is alert and oriented to person, place, and time.  Psychiatric:        Mood and Affect: Mood normal.        Behavior: Behavior normal.     LABS:      Latest Ref Rng & Units 11/10/2022    9:08  AM 06/23/2020    8:37 AM 12/27/2014   11:22 AM  CBC  WBC 4.0 - 10.5 K/uL 8.0  11.3    Hemoglobin 12.0 - 15.0 g/dL 16.1  09.6  04.5   Hematocrit 36.0 - 46.0 % 39.2  36.8  47.0   Platelets 150 - 400 K/uL 268  316        Latest Ref Rng & Units 11/10/2022    9:08 AM 06/23/2020    8:37 AM 12/27/2014   11:22 AM  CMP  Glucose 70 - 99 mg/dL 409  811  914   BUN 8 - 23 mg/dL 39  22  21   Creatinine 0.44 - 1.00 mg/dL 7.82  9.56  2.13   Sodium 135 - 145 mmol/L 138  137  141   Potassium 3.5 - 5.1 mmol/L 5.4  4.0  4.0   Chloride 98 - 111 mmol/L 106  105  104   CO2 22 - 32 mmol/L 24  24    Calcium 8.9 - 10.3 mg/dL 9.5  8.7    Total Protein 6.5 - 8.1 g/dL 7.7  6.0  Total Bilirubin 0.3 - 1.2 mg/dL 0.9  0.9    Alkaline Phos 38 - 126 U/L 99  57    AST 15 - 41 U/L 18  14    ALT 0 - 44 U/L 15  7       No results found for: "CEA1", "CEA" / No results found for: "CEA1", "CEA" No results found for: "PSA1" No results found for: "ZOX096" No results found for: "CAN125"  No results found for: "TOTALPROTELP", "ALBUMINELP", "A1GS", "A2GS", "BETS", "BETA2SER", "GAMS", "MSPIKE", "SPEI" No results found for: "TIBC", "FERRITIN", "IRONPCTSAT" No results found for: "LDH"   STUDIES:   CT CHEST ABDOMEN PELVIS W CONTRAST  Result Date: 11/26/2022 CLINICAL DATA:  Pre therapy for left-sided breast cancer. * Tracking Code: BO * EXAM: CT CHEST, ABDOMEN, AND PELVIS WITH CONTRAST TECHNIQUE: Multidetector CT imaging of the chest, abdomen and pelvis was performed following the standard protocol during bolus administration of intravenous contrast. RADIATION DOSE REDUCTION: This exam was performed according to the departmental dose-optimization program which includes automated exposure control, adjustment of the mA and/or kV according to patient size and/or use of iterative reconstruction technique. CONTRAST:  80mL OMNIPAQUE IOHEXOL 300 MG/ML  SOLN COMPARISON:  None Available. FINDINGS: CT CHEST FINDINGS Cardiovascular: Mild  ascending aortic dilatation. 4.2 cm on 33/2 at 4.3 cm on coronal image 65. Tortuous descending thoracic aorta. Mild cardiomegaly. Three vessel coronary artery calcification. No central pulmonary embolism, on this non-dedicated study. Mediastinum/Nodes: Left-sided thyroid enlargement, including a dominant 4.1 cm nodule on 11/02 No axillary or subpectoral adenopathy. Heterogeneous, likely necrotic adenopathy within the subcarinal station with extension into the azygoesophageal recess. 2.1 x 3.0 cm on 35/2. Right hilar node of 3.0 x 2.1 cm on 31/2. Heterogeneous, likely necrotic left infrahilar node of 1.2 cm on 33/2. No internal mammary adenopathy. Lungs/Pleura: No pleural fluid.  Mild centrilobular emphysema. Multiple small bilateral solid pulmonary nodules. Examples in the right upper lobe at 5 mm on 53/4, within the subpleural superior right middle lobe at 6 mm on 88/4, left lower lobe laterally at 6 mm on 121/4, left lower lobe medially at 5 mm on 124/4. Musculoskeletal: Specially spiculated left breast nodule measures 2.5 x 2.2 cm on 27/2. No acute osseous abnormality. CT ABDOMEN PELVIS FINDINGS Hepatobiliary: Old granulomatous disease within the liver. No focal liver lesion. Normal gallbladder, without biliary ductal dilatation. Pancreas: Normal, without mass or ductal dilatation. Spleen: Normal in size, without focal abnormality. Adrenals/Urinary Tract: Normal adrenal glands. Mild renal cortical thinning bilaterally. Punctate interpolar right renal collecting system calculus. Left renal cysts of up to 2.3 cm . In the absence of clinically indicated signs/symptoms require(s) no independent follow-up. No hydronephrosis. Normal urinary bladder. Stomach/Bowel: Proximal gastric underdistention. Extensive colonic diverticulosis. Normal terminal ileum and appendix. Normal small bowel. Vascular/Lymphatic: Aortic atherosclerosis. No abdominopelvic adenopathy. Reproductive: Retroverted uterus. Central uterine  hypoattenuation of 11 mm on sagittal image 100 and transverse image 105. No adnexal mass. Other: No significant free fluid. Moderate pelvic floor laxity. No evidence of omental or peritoneal disease. Musculoskeletal: Osteopenia. Advanced, right greater than left hip osteoarthritis. Lumbosacral spondylosis with trace L5-S1 anterolisthesis IMPRESSION: 1. Extensive mediastinal and hilar adenopathy, consistent with nodal metastasis. 2. Small bilateral pulmonary nodules which are technically indeterminate but favored to represent metastasis. 3. Left breast primary, without axillary or subdiaphragmatic metastasis. 4. Left-sided thyroid mass of 4.1 cm. Recommend thyroid US (ref: J Am Coll Radiol. 2015 Feb;12(2): 143-50). 5. Central uterine hypoattenuation could represent hyperplasia or carcinoma. Warrants correlation with postmenopausal bleeding and  consideration of pelvic ultrasound. 6. Ascending aortic dilatation at 4.2 cm. This can be re-evaluated on follow-up staging CTs. 7. Aortic atherosclerosis (ICD10-I70.0), coronary artery atherosclerosis and emphysema (ICD10-J43.9). 8. Right nephrolithiasis Electronically Signed   By: Jeronimo Greaves M.D.   On: 11/26/2022 11:33   [place] LT BREAST BX W LOC DEV 1ST LESION IMG BX SPEC US GUIDE  Addendum Date: 11/09/2022   ADDENDUM REPORT: 11/09/2022 12:48 ADDENDUM: PATHOLOGY revealed: Site A. LEFT BREAST, MASS @ 12:30, 4 CMFN, NEEDLE CORE BIOPSY: Invasive moderately differentiated ductal adenocarcinoma. Overall grade: Grade 2. Negative for lymphovascular invasion. Extensive dense hyalinized peritumoral stromal fibrosis. Microcalcifications present within peritumoral stroma. Tumor measures 12 mm in greatest linear extent. Pathology results are CONCORDANT with imaging findings, per Dr. Edwin Cap. PATHOLOGY revealed: Site B. LEFT BREAST, MASS @ 12:30, 7 CMFN, NEEDLE CORE BIOPSY: Ductal carcinoma in situ, intermediate nuclear grade, solid type with focal necrosis and cancerization of  the lobules. Negative for invasive carcinoma. Microcalcifications present within DCIS. Extensive stromal fibrosis with focal adenosis and early fibromatoid change. Pathology results are CONCORDANT with imaging findings, per Dr. Edwin Cap. Pathology results and recommendations below were discussed with patient by telephone on 11/03/2022. Patient reported biopsy site within normal limits with slight tenderness at the site. Post biopsy care instructions were reviewed, questions were answered and my direct phone number was provided to patient. Patient was instructed to call [facility] Mammography Department if any concerns or questions arise related to the biopsy. RECOMMENDATION: Surgical and oncological consultation. Request for surgical and oncological consultation was relayed to Ellin Mayhew RT at [facility] Mammography Department by Randa Lynn RN on 11/03/2022. Pathology results reported by Randa Lynn RN on 11/05/2022. Electronically Signed   By: Bary Richard M.D.   On: 11/09/2022 12:48   Result Date: 11/09/2022 CLINICAL DATA:  Patient presents for ultrasound-guided core biopsy of a 3.1 cm mass in the left breast at the 12:30 position 4 cm from nipple and ultrasound-guided core biopsy of a subtle 0.8 cm mass with possible associated calcifications in the left breast at the 12:30 position 7 cm from nipple. EXAM: ULTRASOUND GUIDED LEFT BREAST CORE NEEDLE BIOPSY COMPARISON:  Previous exam(s). PROCEDURE: I met with the patient and we discussed the procedure of ultrasound-guided biopsy, including benefits and alternatives. We discussed the high likelihood of a successful procedure. We discussed the risks of the procedure, including infection, bleeding, tissue injury, clip migration, and inadequate sampling. Informed written consent was given. The usual time-out protocol was performed immediately prior to the procedure. SITE 1: LEFT BREAST 12:30 4 CMFN 3.1 CM MASS: Lesion quadrant:  UPPER OUTER Using sterile technique and 1% Lidocaine as local anesthetic, under direct ultrasound visualization, a 14 gauge spring-loaded device was used to perform biopsy of the mass in the left breast at the 12:30 position 4 cm from nipple using a medial to lateral approach. At the conclusion of the procedure a ribbon shaped tissue marker clip was deployed into the biopsy cavity. Follow up 2 view mammogram was performed and dictated separately. SITE 2: LEFT BREAST 12:30 7 CMFN 0.8 CM MASS WITH POSSIBLE CALCIFICATIONS: lesion quadrant: UPPER OUTER Using sterile technique and 1% Lidocaine as local anesthetic, under direct ultrasound visualization, a 14 gauge spring-loaded device was used to perform biopsy of subtle/questionable shadowing mass with calcifications in the left breast at the 12:30 position 7 cm from nipple using a medial to lateral approach. At the conclusion of the procedure a wing shaped tissue  marker clip was deployed into the biopsy cavity. Follow up 2 view mammogram was performed and dictated separately. IMPRESSION: 1. Ultrasound-guided core biopsy of the mass in the left breast at the 12:30 position 4 cm from nipple, at site of ribbon shaped biopsy marking clip. 2. Ultrasound-guided core biopsy of the subtle mass with possible calcifications in the left breast at the 12:30 position 7 cm from nipple, at site of wing shaped biopsy marking clip. Electronically Signed: By: Edwin Cap M.D. On: 11/02/2022 08:58   [place] LT BREAST BX W LOC DEV EA ADD LESION IMG BX SPEC US GUIDE  Addendum Date: 11/09/2022   ADDENDUM REPORT: 11/09/2022 12:48 ADDENDUM: PATHOLOGY revealed: Site A. LEFT BREAST, MASS @ 12:30, 4 CMFN, NEEDLE CORE BIOPSY: Invasive moderately differentiated ductal adenocarcinoma. Overall grade: Grade 2. Negative for lymphovascular invasion. Extensive dense hyalinized peritumoral stromal fibrosis. Microcalcifications present within peritumoral stroma. Tumor measures 12 mm in greatest linear  extent. Pathology results are CONCORDANT with imaging findings, per Dr. Edwin Cap. PATHOLOGY revealed: Site B. LEFT BREAST, MASS @ 12:30, 7 CMFN, NEEDLE CORE BIOPSY: Ductal carcinoma in situ, intermediate nuclear grade, solid type with focal necrosis and cancerization of the lobules. Negative for invasive carcinoma. Microcalcifications present within DCIS. Extensive stromal fibrosis with focal adenosis and early fibromatoid change. Pathology results are CONCORDANT with imaging findings, per Dr. Edwin Cap. Pathology results and recommendations below were discussed with patient by telephone on 11/03/2022. Patient reported biopsy site within normal limits with slight tenderness at the site. Post biopsy care instructions were reviewed, questions were answered and my direct phone number was provided to patient. Patient was instructed to call [facility] Mammography Department if any concerns or questions arise related to the biopsy. RECOMMENDATION: Surgical and oncological consultation. Request for surgical and oncological consultation was relayed to Ellin Mayhew RT at [facility] Mammography Department by Randa Lynn RN on 11/03/2022. Pathology results reported by Randa Lynn RN on 11/05/2022. Electronically Signed   By: Bary Richard M.D.   On: 11/09/2022 12:48   Result Date: 11/09/2022 CLINICAL DATA:  Patient presents for ultrasound-guided core biopsy of a 3.1 cm mass in the left breast at the 12:30 position 4 cm from nipple and ultrasound-guided core biopsy of a subtle 0.8 cm mass with possible associated calcifications in the left breast at the 12:30 position 7 cm from nipple. EXAM: ULTRASOUND GUIDED LEFT BREAST CORE NEEDLE BIOPSY COMPARISON:  Previous exam(s). PROCEDURE: I met with the patient and we discussed the procedure of ultrasound-guided biopsy, including benefits and alternatives. We discussed the high likelihood of a successful procedure. We discussed the risks of  the procedure, including infection, bleeding, tissue injury, clip migration, and inadequate sampling. Informed written consent was given. The usual time-out protocol was performed immediately prior to the procedure. SITE 1: LEFT BREAST 12:30 4 CMFN 3.1 CM MASS: Lesion quadrant: UPPER OUTER Using sterile technique and 1% Lidocaine as local anesthetic, under direct ultrasound visualization, a 14 gauge spring-loaded device was used to perform biopsy of the mass in the left breast at the 12:30 position 4 cm from nipple using a medial to lateral approach. At the conclusion of the procedure a ribbon shaped tissue marker clip was deployed into the biopsy cavity. Follow up 2 view mammogram was performed and dictated separately. SITE 2: LEFT BREAST 12:30 7 CMFN 0.8 CM MASS WITH POSSIBLE CALCIFICATIONS: lesion quadrant: UPPER OUTER Using sterile technique and 1% Lidocaine as local anesthetic, under direct ultrasound visualization, a 14  gauge spring-loaded device was used to perform biopsy of subtle/questionable shadowing mass with calcifications in the left breast at the 12:30 position 7 cm from nipple using a medial to lateral approach. At the conclusion of the procedure a wing shaped tissue marker clip was deployed into the biopsy cavity. Follow up 2 view mammogram was performed and dictated separately. IMPRESSION: 1. Ultrasound-guided core biopsy of the mass in the left breast at the 12:30 position 4 cm from nipple, at site of ribbon shaped biopsy marking clip. 2. Ultrasound-guided core biopsy of the subtle mass with possible calcifications in the left breast at the 12:30 position 7 cm from nipple, at site of wing shaped biopsy marking clip. Electronically Signed: By: Edwin Cap M.D. On: 11/02/2022 08:58  MM CLIP PLACEMENT LEFT  Result Date: 11/02/2022 CLINICAL DATA:  Post ultrasound-guided core biopsy of a 3.1 cm mass in the left breast at the 12:30 position 4 cm from nipple and ultrasound-guided core biopsy of  a subtle 0.8 cm mass with calcifications in the left breast at the 12:30 position 7 cm from nipple. EXAM: 3D DIAGNOSTIC LEFT MAMMOGRAM POST ULTRASOUND BIOPSY COMPARISON:  Previous exam(s). FINDINGS: 3D Mammographic images were obtained following ultrasound-guided core biopsy of a 3.1 cm mass in the left breast at the 12:30 position 4 cm from nipple and ultrasound-guided core biopsy of a subtle 0.8 cm mass with calcifications in the left breast at the 12:30 position 7 cm from nipple. The ribbon shaped is present at the site of the biopsied mass in the left breast the 12:30 position 4 cm from nipple. The wing shaped biopsy marking clip is present at the site of the biopsied subtle mass with associated calcifications in the left breast at the 12:30 position 7 cm from nipple. IMPRESSION: 1. Ribbon shaped biopsy marking clip at site of biopsied mass in the left breast at the 12:30 position 4 cm from nipple. 2. Wing shaped biopsy marking clip site of biopsied subtle mass with associated calcifications in the left breast at the 12:30 position 7 cm from nipple. Final Assessment: Post Procedure Mammograms for Marker Placement Electronically Signed   By: Edwin Cap M.D.   On: 11/02/2022 09:04

## 2022-11-29 ENCOUNTER — Encounter: Payer: Self-pay | Admitting: Pharmacist

## 2022-11-29 ENCOUNTER — Other Ambulatory Visit: Payer: Self-pay

## 2022-11-29 ENCOUNTER — Telehealth: Payer: Self-pay | Admitting: Pharmacist

## 2022-11-29 ENCOUNTER — Inpatient Hospital Stay: Payer: 59 | Attending: Hematology | Admitting: Hematology

## 2022-11-29 ENCOUNTER — Other Ambulatory Visit (HOSPITAL_COMMUNITY): Payer: Self-pay

## 2022-11-29 VITALS — BP 148/80 | HR 56 | Temp 97.5°F | Resp 20 | Ht 66.0 in | Wt 171.2 lb

## 2022-11-29 DIAGNOSIS — Z79811 Long term (current) use of aromatase inhibitors: Secondary | ICD-10-CM | POA: Insufficient documentation

## 2022-11-29 DIAGNOSIS — F17210 Nicotine dependence, cigarettes, uncomplicated: Secondary | ICD-10-CM | POA: Insufficient documentation

## 2022-11-29 DIAGNOSIS — R590 Localized enlarged lymph nodes: Secondary | ICD-10-CM | POA: Diagnosis not present

## 2022-11-29 DIAGNOSIS — C50412 Malignant neoplasm of upper-outer quadrant of left female breast: Secondary | ICD-10-CM

## 2022-11-29 DIAGNOSIS — Z170 Estrogen receptor positive status [ER+]: Secondary | ICD-10-CM | POA: Insufficient documentation

## 2022-11-29 MED ORDER — ANASTROZOLE 1 MG PO TABS: 1.0000 mg | ORAL_TABLET | Freq: Every day | ORAL | 6 refills | Status: DC

## 2022-11-29 MED ORDER — PALBOCICLIB 75 MG PO TABS
75.0000 mg | ORAL_TABLET | Freq: Every day | ORAL | 2 refills | Status: DC
Start: 2022-11-29 — End: 2023-01-24
  Filled 2022-11-29: qty 21, 21d supply, fill #0
  Filled 2022-12-01: qty 21, 28d supply, fill #0
  Filled 2022-12-22: qty 21, 28d supply, fill #1
  Filled 2023-01-18: qty 21, 28d supply, fill #2

## 2022-11-29 MED ORDER — PALBOCICLIB 75 MG PO CAPS
75.0000 mg | ORAL_CAPSULE | Freq: Every day | ORAL | 3 refills | Status: DC
Start: 2022-11-29 — End: 2022-11-29
  Filled 2022-11-29: qty 21, 21d supply, fill #0

## 2022-11-29 NOTE — Patient Instructions (Addendum)
[facility] at [facility] Discharge Instructions   You were seen and examined today by Dr. Ellin Saba.  He reviewed the results of your CT scan. 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   We will arrange for you to have a PET scan to see if the cancer has spread to your bones or any other areas/organs of the body.   He sent an estrogen-blocking pill to your pharmacy called anastrozole. He is also sending a pill to a specialty pharmacy that you will take daily for three weeks in a row with one week off. You will repeat taking this pill for three weeks on and one week off. This pill is called [name] and it will be delivered to your home. Expect multiple calls in order to get delivery set up to your home.             We will see you back after the PET scan. Return as scheduled.   Thank you for choosing [facility] at [facility] to provide your oncology and hematology care.  To afford each patient quality time with our provider, please arrive at least 15 minutes before your scheduled appointment time.   If you have a lab appointment with the Cancer Center please come in thru the Main Entrance and check in at the main information desk.  You need to re-schedule your appointment should you arrive 10 or more minutes late.  We strive to give you quality time with our providers, and arriving late affects you and other patients whose appointments are after yours.  Also, if you no show three or more times for appointments you may be dismissed from the clinic at the providers discretion.     Again, thank you for choosing [facility].  Our hope is that these requests will decrease the amount of time that you wait before being seen by our physicians.       _____________________________________________________________  Should you have questions after your visit to [facility], please  contact our office at [phone] and follow the prompts.  Our office hours are 8:00 a.m. and 4:30 p.m. Monday - Friday.  Please note that voicemails left after 4:00 p.m. may not be returned until the following business day.  We are closed weekends and major holidays.  You do have access to a nurse 24-7, just call the main number to the clinic [phone] and do not press any options, hold on the line and a nurse will answer the phone.    For prescription refill requests, have your pharmacy contact our office and allow 72 hours.    Due to Covid, you will need to wear a mask upon entering the hospital. If you do not have a mask, a mask will be given to you at the Main Entrance upon arrival. For doctor visits, patients may have 1 support person age 42 or older with them. For treatment visits, patients can not have anyone with them due to social distancing guidelines and our immunocompromised population.

## 2022-11-29 NOTE — Progress Notes (Signed)
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  Labs from 11/10/22 assessed, appropriate for initiation. No intervention needed. Prescription dose and frequency assessed for appropriateness.   Current medication list in Epic reviewed, no DDIs identified.   Evaluated chart and no patient barriers to medication adherence noted.   Patient agreement for treatment documented in MD note on 11/29/22.  Prescription has been e-scribed to the [facility] for benefits analysis and approval.  Oral Oncology Clinic will continue to follow for insurance authorization, copayment issues, initial counseling and start date.  Lennie Muckle, PharmD Candidate 11/29/2022 1:30 PM Oral Oncology Clinic [phone]

## 2022-12-01 ENCOUNTER — Other Ambulatory Visit: Payer: Self-pay

## 2022-12-01 ENCOUNTER — Telehealth: Payer: Self-pay

## 2022-12-01 ENCOUNTER — Other Ambulatory Visit (HOSPITAL_COMMUNITY): Payer: Self-pay

## 2022-12-01 NOTE — Telephone Encounter (Signed)
Oral Chemotherapy Pharmacist [facility] (Specialty) will deliver medication to patient on 12/03/22.  Patient Education I spoke with patient for overview of new oral chemotherapy medication: Ibrance (palbociclib) for the treatment of metastatic breast cancer, ER/PR+, HER2- , planned duration until disease progression or unacceptable drug toxicity.   Counseled patient on administration, dosing, side effects, monitoring, drug-food interactions, safe handling, storage, and disposal. Patient will take 1 tablet (75 mg total) by mouth daily. Take for 21 days on, 7 days off, repeat every 28 days. .  Side effects include but not limited to: fatigue, decreased wbc, nausea, mouth sores.    Reviewed with patient importance of keeping a medication schedule and plan for any missed doses.  After discussion with patient no patient barriers to medication adherence identified.   Maria Hardy voiced understanding and appreciation. All questions answered. Medication handout provided.  Provided patient with Oral Chemotherapy Navigation Clinic phone number. Patient knows to call the office with questions or concerns. Oral Chemotherapy Navigation Clinic will continue to follow.  [name], PharmD, BCPS, BCOP, CPP Hematology/Oncology Clinical Pharmacist Practitioner [place]/DB/AP Oral Chemotherapy Navigation Clinic [phone]  12/01/2022 1:16 PM

## 2022-12-01 NOTE — Telephone Encounter (Signed)
Oral Oncology Patient Advocate Encounter  Prior Authorization for [name] has been approved.    PA# [id number]  Effective dates: 12/01/22 through 08/16/23  Patients co-pay is $0.00.    Ardeen Fillers, CPhT Oncology Pharmacy Patient Advocate  [facility]  [phone] (phone) [phone] (fax) 12/01/2022 9:11 AM

## 2022-12-01 NOTE — Telephone Encounter (Signed)
Patient successfully OnBoarded and drug education provided by pharmacist. [name] scheduled to be shipped on 12/02/22 for delivery on 12/03/22 from [facility] to patient's home address. Patient also knows to call me at [phone] with any questions or concerns regarding receiving medication or if there are any changes to co-pay.    Ardeen Fillers, CPhT Oncology Pharmacy Patient Advocate  [facility]  [phone] (phone) [phone] (fax) 12/01/2022 1:26 PM

## 2022-12-01 NOTE — Telephone Encounter (Signed)
Oral Oncology Patient Advocate Encounter  New authorization   Received notification that prior authorization for [name] is required.   PA submitted on 12/01/22  Key [id number]  Status is pending     Ardeen Fillers, CPhT Oncology Pharmacy Patient Advocate  [facility]  [phone] (phone) [phone] (fax) 12/01/2022 8:30 AM

## 2022-12-03 ENCOUNTER — Encounter (HOSPITAL_COMMUNITY): Payer: 59

## 2022-12-08 ENCOUNTER — Ambulatory Visit: Admit: 2022-12-08 | Payer: 59 | Admitting: General Surgery

## 2022-12-08 DIAGNOSIS — Z170 Estrogen receptor positive status [ER+]: Secondary | ICD-10-CM

## 2022-12-08 SURGERY — SIMPLE MASTECTOMY
Anesthesia: General | Laterality: Left

## 2022-12-09 ENCOUNTER — Encounter (HOSPITAL_COMMUNITY): Admission: RE | Admit: 2022-12-09 | Payer: 59 | Source: Ambulatory Visit

## 2022-12-09 ENCOUNTER — Inpatient Hospital Stay: Payer: 59

## 2022-12-15 ENCOUNTER — Ambulatory Visit: Payer: 59 | Admitting: Hematology

## 2022-12-15 ENCOUNTER — Other Ambulatory Visit: Payer: Self-pay

## 2022-12-15 DIAGNOSIS — C50412 Malignant neoplasm of upper-outer quadrant of left female breast: Secondary | ICD-10-CM

## 2022-12-16 ENCOUNTER — Encounter (HOSPITAL_COMMUNITY)
Admission: RE | Admit: 2022-12-16 | Discharge: 2022-12-16 | Disposition: A | Discharge status: Home / Self Care | Payer: 59 | Source: Ambulatory Visit | Attending: Hematology | Admitting: Hematology

## 2022-12-16 ENCOUNTER — Inpatient Hospital Stay: Payer: 59 | Attending: Hematology

## 2022-12-16 DIAGNOSIS — M810 Age-related osteoporosis without current pathological fracture: Secondary | ICD-10-CM | POA: Insufficient documentation

## 2022-12-16 DIAGNOSIS — C50412 Malignant neoplasm of upper-outer quadrant of left female breast: Secondary | ICD-10-CM | POA: Insufficient documentation

## 2022-12-16 DIAGNOSIS — Z170 Estrogen receptor positive status [ER+]: Secondary | ICD-10-CM | POA: Insufficient documentation

## 2022-12-16 DIAGNOSIS — C771 Secondary and unspecified malignant neoplasm of intrathoracic lymph nodes: Secondary | ICD-10-CM | POA: Insufficient documentation

## 2022-12-16 DIAGNOSIS — Z79811 Long term (current) use of aromatase inhibitors: Secondary | ICD-10-CM | POA: Insufficient documentation

## 2022-12-16 DIAGNOSIS — F17210 Nicotine dependence, cigarettes, uncomplicated: Secondary | ICD-10-CM | POA: Insufficient documentation

## 2022-12-16 LAB — CBC WITH DIFFERENTIAL/PLATELET
Abs Immature Granulocytes: 0.03 10*3/uL (ref 0.00–0.07)
Basophils Absolute: 0 10*3/uL (ref 0.0–0.1)
Basophils Relative: 1 %
Eosinophils Absolute: 0 10*3/uL (ref 0.0–0.5)
Eosinophils Relative: 1 %
HCT: 36.4 % (ref 36.0–46.0)
Hemoglobin: 11.6 g/dL — ABNORMAL LOW (ref 12.0–15.0)
Immature Granulocytes: 1 %
Lymphocytes Relative: 34 %
Lymphs Abs: 1.1 10*3/uL (ref 0.7–4.0)
MCH: 29.8 pg (ref 26.0–34.0)
MCHC: 31.9 g/dL (ref 30.0–36.0)
MCV: 93.6 fL (ref 80.0–100.0)
Monocytes Absolute: 0.1 10*3/uL (ref 0.1–1.0)
Monocytes Relative: 4 %
Neutro Abs: 1.9 10*3/uL (ref 1.7–7.7)
Neutrophils Relative %: 59 %
Platelets: 227 10*3/uL (ref 150–400)
RBC: 3.89 MIL/uL (ref 3.87–5.11)
RDW: 12.3 % (ref 11.5–15.5)
WBC: 3.3 10*3/uL — ABNORMAL LOW (ref 4.0–10.5)
nRBC: 0 % (ref 0.0–0.2)

## 2022-12-16 LAB — COMPREHENSIVE METABOLIC PANEL
ALT: 12 U/L (ref 0–44)
AST: 15 U/L (ref 15–41)
Albumin: 4.1 g/dL (ref 3.5–5.0)
Alkaline Phosphatase: 86 U/L (ref 38–126)
Anion gap: 8 (ref 5–15)
BUN: 50 mg/dL — ABNORMAL HIGH (ref 8–23)
CO2: 23 mmol/L (ref 22–32)
Calcium: 9.4 mg/dL (ref 8.9–10.3)
Chloride: 107 mmol/L (ref 98–111)
Creatinine, Ser: 1.72 mg/dL — ABNORMAL HIGH (ref 0.44–1.00)
GFR, Estimated: 29 mL/min — ABNORMAL LOW (ref >60)
Glucose, Bld: 105 mg/dL — ABNORMAL HIGH (ref 70–99)
Potassium: 5 mmol/L (ref 3.5–5.1)
Sodium: 138 mmol/L (ref 135–145)
Total Bilirubin: 0.8 mg/dL (ref 0.3–1.2)
Total Protein: 7.6 g/dL (ref 6.5–8.1)

## 2022-12-16 LAB — VITAMIN D 25 HYDROXY (VIT D DEFICIENCY, FRACTURES): Vit D, 25-Hydroxy: 58.51 ng/mL (ref 30–100)

## 2022-12-16 LAB — MAGNESIUM: Magnesium: 2.3 mg/dL (ref 1.7–2.4)

## 2022-12-16 MED ORDER — FLUDEOXYGLUCOSE F - 18 (FDG) INJECTION
8.4700 | Freq: Once | INTRAVENOUS | Status: AC | PRN
  Administered 2022-12-16: 8.47 via INTRAVENOUS

## 2022-12-17 LAB — CANCER ANTIGEN 27.29: CA 27.29: 60.3 U/mL — ABNORMAL HIGH (ref 0.0–38.6)

## 2022-12-18 LAB — CANCER ANTIGEN 15-3: CA 15-3: 29.6 U/mL — ABNORMAL HIGH (ref 0.0–25.0)

## 2022-12-21 NOTE — Progress Notes (Signed)
[facility] [address] [place] [address]    Clinic Day:  12/22/2022  Referring physician: Marylynn Pearson, FNP  Patient Care Team: Marylynn Pearson, FNP as PCP - General (Family Medicine)   ASSESSMENT & PLAN:   Assessment: 1.  Stage IV (T2 N0 G2, ER/PR+, HER2-) left breast UOQ IDC: - She noticed left breast mass in the first week of March. - Mammogram (10/21/2022): Irregular hypoechoic mass in the left breast at 12:30 position 4 CFN measuring 3.1 x 2.1 x 3 cm.  Mass extends to the level of skin.  There is a subtle shadowing mass in the left breast at the 12:30 position 7 CFN measuring 0.4 x 0.7 x 0.8 cm.  No axillary adenopathy. - Biopsy (11/02/2022): Grade 2 IDC, ER 70% moderate strong, PR 0%, HER2 1+, Ki-67 20%. - Ibrance 75 mg 3 weeks on/1 week off and anastrozole started on 12/03/2022.   2.  Left breast DCIS: - Biopsy (11/02/2022): DCIS, ER 90% strong staining, PR 0%   3.  Social/family history: - She is widowed 9 years ago and lives with her youngest son at home.  She does not drive but is independent of ADLs and some IADLs.  She worked on her tobacco form prior to retirement.  Current active smoker, 1 pack/day, started smoking at age 63. - Sister had breast cancer in her 67s.  Another sister had basal cell skin cancer.    Plan: 1.  Stage IV (T2 N0 G2, ER/PR+, HER2-) left breast UOQ IDC: - Reviewed PET scan images from 12/16/2022: 2.5 cm central left breast mass, thoracic nodal metastasis and tiny bilateral pulmonary nodules nonspecific.  Hypermetabolic left posterior thyroid nodule. - She started [place] on 12/03/2022.  She noticed mild fatigue but denied any major side effects. - Labs from 12/16/2022: White count is 3.3 with normal ANC.  LFTs are normal.  Creatinine is 1.72 and elevated from 11/10/2022. - She will continue Ibrance.  RTC 4 weeks for follow-up with repeat labs and CA 15-3.   2.  Osteoporosis (DEXA 09/15/2022 T-score -3): - We discussed about  starting her on Prolia 60 mg every 6 months.  No bone metastasis on PET scan. - We discussed side effects including rare chance of ONJ and hypocalcemia.  She will start taking calcium supplements.  Vitamin D level today is 58. - We will start first dose of Prolia today.  3.  CKD: - Referral to Dr. Wolfgang Phoenix sent.  4.  Right hip and right knee pain: - She has severe arthritis of the right hip. - Will give her tramadol 50 mg 1-2 times daily as needed.  I have warned her of drowsiness and constipation.    Orders Placed This Encounter  Procedures   Genetic Screening Order    Standing Status:   Future    Standing Expiration Date:   12/22/2023   CBC with Differential    Standing Status:   Future    Standing Expiration Date:   12/22/2023   Comprehensive metabolic panel    Standing Status:   Future    Standing Expiration Date:   12/22/2023   Cancer antigen 15-3    Standing Status:   Future    Standing Expiration Date:   12/22/2023      I,Katie Daubenspeck,acting as a scribe for Doreatha Massed, MD.,have documented all relevant documentation on the behalf of Doreatha Massed, MD,as directed by  Doreatha Massed, MD while in the presence of Doreatha Massed, MD.  I, Doreatha Massed MD, have reviewed the above documentation for accuracy and completeness, and I agree with the above.   Doreatha Massed, MD   5/8/20245:28 PM  CHIEF COMPLAINT:   Diagnosis: left breast carcinoma    Cancer Staging  Breast cancer of upper-outer quadrant of left female breast [facility]) Staging form: Breast, AJCC 8th Edition - Clinical stage from 11/10/2022: Stage IV (cT2, cN0, cM1, G2, ER+, PR-, HER2-) - Unsigned  Ductal carcinoma in situ (DCIS) of left breast Staging form: Breast, AJCC 8th Edition - Clinical stage from 11/10/2022: Stage 0 (cTis (DCIS), cN0, cM0, ER+, PR-, HER2: Not Assessed) - Unsigned    Prior Therapy: none  Current Therapy:  Anastrozole and Ibrance    HISTORY OF PRESENT  ILLNESS:   Oncology History   No history exists.     INTERVAL HISTORY:   Maria Hardy is a 82 y.o. female presenting to clinic today for follow up of left breast carcinoma. She was last seen by me on 11/29/22.  Since her last visit, she underwent staging PET scan on 12/16/22 showing: 2.5 cm central left breast mass; associated thoracic nodal metastasis; nonspecific tiny bilateral pulmonary nodules; hypermetabolic left posterior thyroid nodule.  Today, she states that she is doing well overall. Her appetite level is at 90%. Her energy level is at 70%.  PAST MEDICAL HISTORY:   Past Medical History: 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 breast, diagnosed 10/2022   Hypertension    Osteoporosis    Scoliosis    Vertigo     Surgical History: Past Surgical History:  Procedure Laterality Date   BREAST BIOPSY Left 11/02/2022   [place] LT BREAST BX W LOC DEV 1ST LESION IMG BX SPEC US GUIDE 11/02/2022 AP-ULTRASOUND   BREAST BIOPSY Left 11/02/2022   [place] LT BREAST BX W LOC DEV EA ADD LESION IMG BX SPEC US GUIDE 11/02/2022 AP-ULTRASOUND   thyroid tumopr remo N/A    THYROIDECTOMY      Social History: Social History   Socioeconomic History   Marital status: Widowed    Spouse name: Not on file   Number of children: Not on file   Years of education: Not on file   Highest education level: Not on file  Occupational History   Occupation: owned tobacco farm  Tobacco Use   Smoking status: Every Day    Packs/day: 1.00    Years: 58.00    Additional pack years: 0.00    Total pack years: 58.00    Types: Cigarettes   Smokeless tobacco: Never  Vaping Use   Vaping Use: Never used  Substance and Sexual Activity   Alcohol use: No   Drug use: No   Sexual activity: Not on file  Other Topics Concern   Not on file  Social History Narrative   Not on file   Social Determinants of Health   Financial Resource Strain: Not on file  Food  Insecurity: No Food Insecurity (11/10/2022)   Hunger Vital Sign    Worried About Running Out of Food in the Last Year: Never true    Ran Out of Food in the Last Year: Never true  Transportation Needs: No Transportation Needs (11/10/2022)   PRAPARE - Administrator, Civil Service (Medical): No    Lack of Transportation (Non-Medical): No  Physical Activity: Not on file  Stress: Not on file  Social Connections: Not on file  Intimate Partner Violence: Not At Risk (11/10/2022)   Humiliation, Afraid, Rape, and Kick questionnaire    Fear of Current or Ex-Partner: No    Emotionally Abused: No    Physically Abused: No    Sexually Abused: No    Family History: Family History  Problem Relation Age of Onset   Breast cancer Sister 50 - 61   Basal cell carcinoma Sister    Leukemia Son 3    Current Medications:  Current Outpatient Medications:    acetaminophen (TYLENOL) 500 MG tablet, Take 500 mg by mouth every 6 (six) hours as needed (pain.)., Disp: , Rfl:    amLODipine (NORVASC) 10 MG tablet, Take 10 mg by mouth every evening., Disp: , Rfl:    anastrozole (ARIMIDEX) 1 MG tablet, Take 1 tablet (1 mg total) by mouth daily. (Patient taking differently: Take 1 mg by mouth every evening.), Disp: 30 tablet, Rfl: 6   Ascorbic Acid (VITAMIN C PO), Take 1 tablet by mouth in the morning., Disp: , Rfl:    Cholecalciferol (VITAMIN D-3 PO), Take 1 tablet by mouth in the morning., Disp: , Rfl:    diclofenac (VOLTAREN) 75 MG EC tablet, Take 1 tablet (75 mg total) by mouth 2 (two) times daily with a meal., Disp: 60 tablet, Rfl: 2   dicyclomine (BENTYL) 10 MG capsule, Take 10 mg by mouth 3 (three) times daily., Disp: , Rfl:    gabapentin (NEURONTIN) 100 MG capsule, Take 100 mg by mouth 2 (two) times daily., Disp: , Rfl:    hydrOXYzine (ATARAX) 25 MG tablet, Take 25 mg by mouth 3 (three) times daily., Disp: , Rfl:    losartan (COZAAR) 100 MG tablet, Take 100 mg by mouth in the morning., Disp: , Rfl:     Magnesium Oxide (MAG-OX PO), Take 1 tablet by mouth in the morning., Disp: , Rfl:    meclizine (ANTIVERT) 25 MG tablet, Take 25 mg by mouth 3 (three) times daily as needed for dizziness., Disp: , Rfl:    metoprolol tartrate (LOPRESSOR) 25 MG tablet, Take 25 mg by mouth 2 (two) times daily., Disp: , Rfl:    Multiple Vitamin (MULTIVITAMIN WITH MINERALS) TABS tablet, Take 1 tablet by mouth in the morning., Disp: , Rfl:    naproxen sodium (ALEVE) 220 MG tablet, Take 220 mg by mouth daily as needed (pain.)., Disp: , Rfl:    Omega-3 Fatty Acids (FISH OIL PO), Take 1 capsule by mouth in the morning., Disp: , Rfl:    ondansetron (ZOFRAN ODT) 4 MG disintegrating tablet, Take 1 tablet (4 mg total) by mouth every 8 (eight) hours as needed. (Patient not taking: Reported on 11/29/2022), Disp: 10 tablet, Rfl: 1   palbociclib (IBRANCE) 75 MG tablet, Take 1 tablet (75 mg total) by mouth daily. Take for 21 days on, 7 days off, repeat every 28 days., Disp: 21 tablet, Rfl: 2   traMADol (ULTRAM) 50 MG tablet, Take 1 tablet (50 mg total) by mouth every 12 (twelve) hours as needed., Disp: 60 tablet, Rfl: 0   Allergies: No Known Allergies  REVIEW OF SYSTEMS:   Review of Systems  Constitutional:  Negative for chills, fatigue and fever.  HENT:   Negative for lump/mass, mouth sores, nosebleeds, sore throat and trouble swallowing.   Eyes:  Negative for eye problems.  Respiratory:  Negative for cough and shortness of breath.   Cardiovascular:  Negative for chest pain, leg swelling and palpitations.  Gastrointestinal:  Negative for abdominal pain, constipation, diarrhea, nausea and  vomiting.  Genitourinary:  Positive for frequency. Negative for bladder incontinence, difficulty urinating, dysuria, hematuria and nocturia.   Musculoskeletal:  Positive for arthralgias. Negative for back pain, flank pain, myalgias and neck pain.  Skin:  Negative for itching and rash.  Neurological:  Negative for dizziness, headaches and  numbness.  Hematological:  Does not bruise/bleed easily.  Psychiatric/Behavioral:  Negative for depression, sleep disturbance and suicidal ideas. The patient is not nervous/anxious.   All other systems reviewed and are negative.    VITALS:   Blood pressure (!) 142/68, pulse 69, temperature 97.9 F (36.6 C), temperature source Oral, resp. rate 18, height 5\' 6"  (1.676 m), weight 169 lb 11.2 oz (77 kg), SpO2 100 %.  Wt Readings from Last 3 Encounters:  12/22/22 169 lb 11.2 oz (77 kg)  11/29/22 171 lb 3.2 oz (77.7 kg)  11/23/22 172 lb (78 kg)    Body mass index is 27.39 kg/m.  Performance status (ECOG): 1 - Symptomatic but completely ambulatory  PHYSICAL EXAM:   Physical Exam Vitals and nursing note reviewed. Exam conducted with a chaperone present.  Constitutional:      Appearance: Normal appearance.  Cardiovascular:     Rate and Rhythm: Normal rate and regular rhythm.     Pulses: Normal pulses.     Heart sounds: Normal heart sounds.  Pulmonary:     Effort: Pulmonary effort is normal.     Breath sounds: Normal breath sounds.  Abdominal:     Palpations: Abdomen is soft. There is no hepatomegaly, splenomegaly or mass.     Tenderness: There is no abdominal tenderness.  Musculoskeletal:     Right lower leg: No edema.     Left lower leg: No edema.  Lymphadenopathy:     Cervical: No cervical adenopathy.     Right cervical: No superficial, deep or posterior cervical adenopathy.    Left cervical: No superficial, deep or posterior cervical adenopathy.     Upper Body:     Right upper body: No supraclavicular or axillary adenopathy.     Left upper body: No supraclavicular or axillary adenopathy.  Neurological:     General: No focal deficit present.     Mental Status: She is alert and oriented to person, place, and time.  Psychiatric:        Mood and Affect: Mood normal.        Behavior: Behavior normal.     LABS:      Latest Ref Rng & Units 12/16/2022    8:48 AM 11/10/2022     9:08 AM 06/23/2020    8:37 AM  CBC  WBC 4.0 - 10.5 K/uL 3.3  8.0  11.3   Hemoglobin 12.0 - 15.0 g/dL 09.8  11.9  14.7   Hematocrit 36.0 - 46.0 % 36.4  39.2  36.8   Platelets 150 - 400 K/uL 227  268  316       Latest Ref Rng & Units 12/16/2022    8:48 AM 11/10/2022    9:08 AM 06/23/2020    8:37 AM  CMP  Glucose 70 - 99 mg/dL 829  562  130   BUN 8 - 23 mg/dL 50  39  22   Creatinine 0.44 - 1.00 mg/dL 8.65  7.84  6.96   Sodium 135 - 145 mmol/L 138  138  137   Potassium 3.5 - 5.1 mmol/L 5.0  5.4  4.0   Chloride 98 - 111 mmol/L 107  106  105   CO2 22 -  32 mmol/L 23  24  24    Calcium 8.9 - 10.3 mg/dL 9.4  9.5  8.7   Total Protein 6.5 - 8.1 g/dL 7.6  7.7  6.0   Total Bilirubin 0.3 - 1.2 mg/dL 0.8  0.9  0.9   Alkaline Phos 38 - 126 U/L 86  99  57   AST 15 - 41 U/L 15  18  14    ALT 0 - 44 U/L 12  15  7       No results found for: "CEA1", "CEA" / No results found for: "CEA1", "CEA" No results found for: "PSA1" No results found for: "ZOX096" No results found for: "CAN125"  No results found for: "TOTALPROTELP", "ALBUMINELP", "A1GS", "A2GS", "BETS", "BETA2SER", "GAMS", "MSPIKE", "SPEI" No results found for: "TIBC", "FERRITIN", "IRONPCTSAT" No results found for: "LDH"   STUDIES:   NM PET Image Initial (PI) Skull Base To Thigh  Result Date: 12/21/2022 CLINICAL DATA:  Initial treatment strategy for left breast cancer. EXAM: NUCLEAR MEDICINE PET SKULL BASE TO THIGH TECHNIQUE: 8.5 mCi F-18 FDG was injected intravenously. Full-ring PET imaging was performed from the skull base to thigh after the radiotracer. CT data was obtained and used for attenuation correction and anatomic localization. Fasting blood glucose: 110 mg/dl COMPARISON:  CT chest abdomen pelvis dated 11/25/2022 FINDINGS: Mediastinal blood pool activity: SUV max 2.8 Liver activity: SUV max NA NECK: No hypermetabolic thoracic lymphadenopathy. Heterogeneous enlargement with nodularity involving the left thyroid. Associated focal  hypermetabolism posteriorly in the left thyroid gland, max SUV 8.6. Incidental CT findings: None. CHEST: 2.5 cm central left breast mass (series 3/image 98), max SUV 5.0, reflecting the patient's known primary breast neoplasm. Associated thoracic lymphadenopathy, including: --19 mm short axis right hilar node (series 3/image 89), max SUV 7.7 --19 mm short axis subcarinal node (series 3/image 94), max SUV 7.1 --11 mm short axis left perihilar/infrahilar node (series 3/image 91), max SUV 5.1 Tiny bilateral pulmonary nodules are poorly evaluated and non FDG avid, although beneath the size threshold for PET sensitivity. Incidental CT findings: Atherosclerotic calcifications of the aortic arch. Moderate three-vessel coronary atherosclerosis. ABDOMEN/PELVIS: No abnormal hypermetabolism in the liver, spleen, pancreas, or adrenal glands. No hypermetabolic abdominopelvic lymphadenopathy. Incidental CT findings: Calcified hepatic granulomata. Left renal cysts, poorly visualized/evaluated. Atherosclerotic calcifications of the abdominal aorta and branch vessels. Extensive sigmoid diverticulosis, without associated inflammatory changes. SKELETON: No focal hypermetabolic activity to suggest skeletal metastasis. Incidental CT findings: Degenerative changes of the thoracolumbar spine. Degenerative changes of the bilateral hips, right greater than left. IMPRESSION: 2.5 cm central left breast mass, reflecting the patient's known primary breast neoplasm. Associated thoracic nodal metastases, as above. Tiny bilateral pulmonary nodules, nonspecific. Attention on follow-up is suggested. Hypermetabolic left posterior thyroid nodule. In this clinical setting (given age and comorbidities), follow-up thyroid ultrasound is considered optional. Additional ancillary findings as above. Electronically Signed   By: Charline Bills M.D.   On: 12/21/2022 00:09   CT CHEST ABDOMEN PELVIS W CONTRAST  Result Date: 11/26/2022 CLINICAL DATA:  Pre  therapy for left-sided breast cancer. * Tracking Code: BO * EXAM: CT CHEST, ABDOMEN, AND PELVIS WITH CONTRAST TECHNIQUE: Multidetector CT imaging of the chest, abdomen and pelvis was performed following the standard protocol during bolus administration of intravenous contrast. RADIATION DOSE REDUCTION: This exam was performed according to the departmental dose-optimization program which includes automated exposure control, adjustment of the mA and/or kV according to patient size and/or use of iterative reconstruction technique. CONTRAST:  80mL OMNIPAQUE IOHEXOL 300 MG/ML  SOLN  COMPARISON:  None Available. FINDINGS: CT CHEST FINDINGS Cardiovascular: Mild ascending aortic dilatation. 4.2 cm on 33/2 at 4.3 cm on coronal image 65. Tortuous descending thoracic aorta. Mild cardiomegaly. Three vessel coronary artery calcification. No central pulmonary embolism, on this non-dedicated study. Mediastinum/Nodes: Left-sided thyroid enlargement, including a dominant 4.1 cm nodule on 11/02 No axillary or subpectoral adenopathy. Heterogeneous, likely necrotic adenopathy within the subcarinal station with extension into the azygoesophageal recess. 2.1 x 3.0 cm on 35/2. Right hilar node of 3.0 x 2.1 cm on 31/2. Heterogeneous, likely necrotic left infrahilar node of 1.2 cm on 33/2. No internal mammary adenopathy. Lungs/Pleura: No pleural fluid.  Mild centrilobular emphysema. Multiple small bilateral solid pulmonary nodules. Examples in the right upper lobe at 5 mm on 53/4, within the subpleural superior right middle lobe at 6 mm on 88/4, left lower lobe laterally at 6 mm on 121/4, left lower lobe medially at 5 mm on 124/4. Musculoskeletal: Specially spiculated left breast nodule measures 2.5 x 2.2 cm on 27/2. No acute osseous abnormality. CT ABDOMEN PELVIS FINDINGS Hepatobiliary: Old granulomatous disease within the liver. No focal liver lesion. Normal gallbladder, without biliary ductal dilatation. Pancreas: Normal, without mass or  ductal dilatation. Spleen: Normal in size, without focal abnormality. Adrenals/Urinary Tract: Normal adrenal glands. Mild renal cortical thinning bilaterally. Punctate interpolar right renal collecting system calculus. Left renal cysts of up to 2.3 cm . In the absence of clinically indicated signs/symptoms require(s) no independent follow-up. No hydronephrosis. Normal urinary bladder. Stomach/Bowel: Proximal gastric underdistention. Extensive colonic diverticulosis. Normal terminal ileum and appendix. Normal small bowel. Vascular/Lymphatic: Aortic atherosclerosis. No abdominopelvic adenopathy. Reproductive: Retroverted uterus. Central uterine hypoattenuation of 11 mm on sagittal image 100 and transverse image 105. No adnexal mass. Other: No significant free fluid. Moderate pelvic floor laxity. No evidence of omental or peritoneal disease. Musculoskeletal: Osteopenia. Advanced, right greater than left hip osteoarthritis. Lumbosacral spondylosis with trace L5-S1 anterolisthesis IMPRESSION: 1. Extensive mediastinal and hilar adenopathy, consistent with nodal metastasis. 2. Small bilateral pulmonary nodules which are technically indeterminate but favored to represent metastasis. 3. Left breast primary, without axillary or subdiaphragmatic metastasis. 4. Left-sided thyroid mass of 4.1 cm. Recommend thyroid US (ref: J Am Coll Radiol. 2015 Feb;12(2): 143-50). 5. Central uterine hypoattenuation could represent hyperplasia or carcinoma. Warrants correlation with postmenopausal bleeding and consideration of pelvic ultrasound. 6. Ascending aortic dilatation at 4.2 cm. This can be re-evaluated on follow-up staging CTs. 7. Aortic atherosclerosis (ICD10-I70.0), coronary artery atherosclerosis and emphysema (ICD10-J43.9). 8. Right nephrolithiasis Electronically Signed   By: Jeronimo Greaves M.D.   On: 11/26/2022 11:33

## 2022-12-22 ENCOUNTER — Other Ambulatory Visit: Payer: Self-pay

## 2022-12-22 ENCOUNTER — Other Ambulatory Visit: Payer: Self-pay | Admitting: *Deleted

## 2022-12-22 ENCOUNTER — Other Ambulatory Visit (HOSPITAL_COMMUNITY): Payer: Self-pay

## 2022-12-22 ENCOUNTER — Inpatient Hospital Stay (HOSPITAL_BASED_OUTPATIENT_CLINIC_OR_DEPARTMENT_OTHER): Payer: 59 | Admitting: Hematology

## 2022-12-22 VITALS — BP 142/68 | HR 69 | Temp 97.9°F | Resp 18 | Ht 66.0 in | Wt 169.7 lb

## 2022-12-22 DIAGNOSIS — Z170 Estrogen receptor positive status [ER+]: Secondary | ICD-10-CM

## 2022-12-22 DIAGNOSIS — C50412 Malignant neoplasm of upper-outer quadrant of left female breast: Secondary | ICD-10-CM

## 2022-12-22 DIAGNOSIS — F17210 Nicotine dependence, cigarettes, uncomplicated: Secondary | ICD-10-CM | POA: Insufficient documentation

## 2022-12-22 DIAGNOSIS — Z79811 Long term (current) use of aromatase inhibitors: Secondary | ICD-10-CM | POA: Insufficient documentation

## 2022-12-22 DIAGNOSIS — M810 Age-related osteoporosis without current pathological fracture: Secondary | ICD-10-CM | POA: Diagnosis present

## 2022-12-22 DIAGNOSIS — C771 Secondary and unspecified malignant neoplasm of intrathoracic lymph nodes: Secondary | ICD-10-CM | POA: Insufficient documentation

## 2022-12-22 MED ORDER — TRAMADOL HCL 50 MG PO TABS: 50.0000 mg | ORAL_TABLET | Freq: Two times a day (BID) | ORAL | 0 refills | Status: DC | PRN

## 2022-12-22 NOTE — Patient Instructions (Addendum)
[facility] at [facility] Discharge Instructions   You were seen and examined today by Dr. Ellin Saba.  He reviewed the results of your lab work which are normal/stable.  He reviewed the results of your PET scan. It shows the known cancer in the breast as well as the cancer in the lymph nodes in the chest. There was no other cancer seen in other areas/organs of the body.   Continue Ibrance and anastrozole as prescribed.   We sent a prescription to your pharmacy for Tramadol. This is for your back and hip pain. You may take 1 pill every 12 hours as needed for pain. This medication may make you drowsy.   Return as scheduled.    Thank you for choosing [facility] at [facility] to provide your oncology and hematology care.  To afford each patient quality time with our provider, please arrive at least 15 minutes before your scheduled appointment time.   If you have a lab appointment with the Cancer Center please come in thru the Main Entrance and check in at the main information desk.  You need to re-schedule your appointment should you arrive 10 or more minutes late.  We strive to give you quality time with our providers, and arriving late affects you and other patients whose appointments are after yours.  Also, if you no show three or more times for appointments you may be dismissed from the clinic at the providers discretion.     Again, thank you for choosing [facility].  Our hope is that these requests will decrease the amount of time that you wait before being seen by our physicians.       _____________________________________________________________  Should you have questions after your visit to [facility], please contact our office at [phone] and follow the prompts.  Our office hours are 8:00 a.m. and 4:30 p.m. Monday - Friday.  Please note that voicemails left after 4:00 p.m. may not be returned until the  following business day.  We are closed weekends and major holidays.  You do have access to a nurse 24-7, just call the main number to the clinic [phone] and do not press any options, hold on the line and a nurse will answer the phone.    For prescription refill requests, have your pharmacy contact our office and allow 72 hours.    Due to Covid, you will need to wear a mask upon entering the hospital. If you do not have a mask, a mask will be given to you at the Main Entrance upon arrival. For doctor visits, patients may have 1 support person age 53 or older with them. For treatment visits, patients can not have anyone with them due to social distancing guidelines and our immunocompromised population.

## 2022-12-22 NOTE — Progress Notes (Signed)
Patient is taking Ibrance as prescribed.  She has not missed any doses and reports no side effects at this time.   °

## 2022-12-27 ENCOUNTER — Other Ambulatory Visit (HOSPITAL_COMMUNITY): Payer: Self-pay

## 2022-12-30 ENCOUNTER — Inpatient Hospital Stay: Payer: 59

## 2022-12-30 VITALS — BP 140/64 | HR 58 | Resp 19

## 2022-12-30 DIAGNOSIS — M810 Age-related osteoporosis without current pathological fracture: Secondary | ICD-10-CM | POA: Diagnosis not present

## 2022-12-30 MED ORDER — DENOSUMAB 60 MG/ML ~~LOC~~ SOSY
60.0000 mg | PREFILLED_SYRINGE | Freq: Once | SUBCUTANEOUS | Status: AC
  Administered 2022-12-30: 60 mg via SUBCUTANEOUS
  Filled 2022-12-30: qty 1

## 2022-12-30 NOTE — Progress Notes (Signed)
Patient tolerated Prolia injection with no complaints voiced.  Site clean and dry with no bruising or swelling noted.  No complaints of pain.  Discharged with vital signs stable and no signs or symptoms of distress noted.  °

## 2022-12-30 NOTE — Patient Instructions (Signed)
[facility]  Discharge Instructions: Thank you for choosing [facility] to provide your oncology and hematology care.  If you have a lab appointment with the Cancer Center - please note that after April 8th, 2024, all labs will be drawn in the cancer center.  You do not have to check in or register with the main entrance as you have in the past but will complete your check-in in the cancer center.  Wear comfortable clothing and clothing appropriate for easy access to any Portacath or PICC line.   We strive to give you quality time with your provider. You may need to reschedule your appointment if you arrive late (15 or more minutes).  Arriving late affects you and other patients whose appointments are after yours.  Also, if you miss three or more appointments without notifying the office, you may be dismissed from the clinic at the provider's discretion.      For prescription refill requests, have your pharmacy contact our office and allow 72 hours for refills to be completed.    Today you received the following chemotherapy and/or immunotherapy agents Prolia.  Denosumab Injection (Osteoporosis) What is this medication? DENOSUMAB (den oh SUE mab) prevents and treats osteoporosis. It works by Interior and spatial designer stronger and less likely to break (fracture). It is a monoclonal antibody. This medicine may be used for other purposes; ask your health care provider or pharmacist if you have questions. COMMON BRAND NAME(S): Prolia What should I tell my care team before I take this medication? They need to know if you have any of these conditions: Dental or gum disease, or plan to have dental surgery or a tooth pulled Infection Kidney disease Low levels of calcium or vitamin D in your blood On dialysis Poor nutrition Skin conditions Thyroid disease, or have had thyroid or parathyroid surgery Trouble absorbing minerals in your stomach or intestine An unusual or  allergic reaction to denosumab, other medications, foods, dyes, or preservatives Pregnant or trying to get pregnant Breastfeeding How should I use this medication? This medication is injected under the skin. It is given by your care team in a hospital or clinic setting. A special MedGuide will be given to you before each treatment. Be sure to read this information carefully each time. Talk to your care team about the use of this medication in children. Special care may be needed. Overdosage: If you think you have taken too much of this medicine contact a poison control center or emergency room at once. NOTE: This medicine is only for you. Do not share this medicine with others. What if I miss a dose? Keep appointments for follow-up doses. It is important not to miss your dose. Call your care team if you are unable to keep an appointment. What may interact with this medication? Do not take this medication with any of the following: Other medications that contain denosumab This medication may also interact with the following: Medications that lower your chance of fighting infection Steroid medications, such as prednisone or cortisone This list may not describe all possible interactions. Give your health care provider a list of all the medicines, herbs, non-prescription drugs, or dietary supplements you use. Also tell them if you smoke, drink alcohol, or use illegal drugs. Some items may interact with your medicine. What should I watch for while using this medication? Your condition will be monitored carefully while you are receiving this medication. You may need blood work while taking this medication.  This medication may increase your risk of getting an infection. Call your care team for advice if you get a fever, chills, sore throat, or other symptoms of a cold or flu. Do not treat yourself. Try to avoid being around people who are sick. Tell your dentist and dental surgeon that you are taking  this medication. You should not have major dental surgery while on this medication. See your dentist to have a dental exam and fix any dental problems before starting this medication. Take good care of your teeth while on this medication. Make sure you see your dentist for regular follow-up appointments. You should make sure you get enough calcium and vitamin D while you are taking this medication. Discuss the foods you eat and the vitamins you take with your care team. Talk to your care team if you are pregnant or think you might be pregnant. This medication can cause serious birth defects if taken during pregnancy and for 5 months after the last dose. You will need a negative pregnancy test before starting this medication. Contraception is recommended while taking this medication and for 5 months after the last dose. Your care team can help you find the option that works for you. Talk to your care team before breastfeeding. Changes to your treatment plan may be needed. What side effects may I notice from receiving this medication? Side effects that you should report to your care team as soon as possible: Allergic reactions--skin rash, itching, hives, swelling of the face, lips, tongue, or throat Infection--fever, chills, cough, sore throat, wounds that don't heal, pain or trouble when passing urine, general feeling of discomfort or being unwell Low calcium level--muscle pain or cramps, confusion, tingling, or numbness in the hands or feet Osteonecrosis of the jaw--pain, swelling, or redness in the mouth, numbness of the jaw, poor healing after dental work, unusual discharge from the mouth, visible bones in the mouth Severe bone, joint, or muscle pain Skin infection--skin redness, swelling, warmth, or pain Side effects that usually do not require medical attention (report these to your care team if they continue or are bothersome): Back pain Headache Joint pain Muscle pain Pain in the hands, arms,  legs, or feet Runny or stuffy nose Sore throat This list may not describe all possible side effects. Call your doctor for medical advice about side effects. You may report side effects to FDA at 1-800-FDA-1088. Where should I keep my medication? This medication is given in a hospital or clinic. It will not be stored at home. NOTE: This sheet is a summary. It may not cover all possible information. If you have questions about this medicine, talk to your doctor, pharmacist, or health care provider.  2023 Elsevier/Gold Standard (2021-12-14 00:00:00)     To help prevent nausea and vomiting after your treatment, we encourage you to take your nausea medication as directed.  BELOW ARE SYMPTOMS THAT SHOULD BE REPORTED IMMEDIATELY: *FEVER GREATER THAN 100.4 F (38 C) OR HIGHER *CHILLS OR SWEATING *NAUSEA AND VOMITING THAT IS NOT CONTROLLED WITH YOUR NAUSEA MEDICATION *UNUSUAL SHORTNESS OF BREATH *UNUSUAL BRUISING OR BLEEDING *URINARY PROBLEMS (pain or burning when urinating, or frequent urination) *BOWEL PROBLEMS (unusual diarrhea, constipation, pain near the anus) TENDERNESS IN MOUTH AND THROAT WITH OR WITHOUT PRESENCE OF ULCERS (sore throat, sores in mouth, or a toothache) UNUSUAL RASH, SWELLING OR PAIN  UNUSUAL VAGINAL DISCHARGE OR ITCHING   Items with * indicate a potential emergency and should be followed up as soon as possible or go  to the Emergency Department if any problems should occur.  Please show the CHEMOTHERAPY ALERT CARD or IMMUNOTHERAPY ALERT CARD at check-in to the Emergency Department and triage nurse.  Should you have questions after your visit or need to cancel or reschedule your appointment, please contact [facility] [phone]  and follow the prompts.  Office hours are 8:00 a.m. to 4:30 p.m. Monday - Friday. Please note that voicemails left after 4:00 p.m. may not be returned until the following business day.  We are closed weekends and major  holidays. You have access to a nurse at all times for urgent questions. Please call the main number to the clinic [phone] and follow the prompts.  For any non-urgent questions, you may also contact your provider using MyChart. We now offer e-Visits for anyone 68 and older to request care online for non-urgent symptoms. For details visit [email or web address].   Also download the MyChart app! Go to the app store, search "MyChart", open the app, select [place], and log in with your MyChart username and password.

## 2022-12-31 ENCOUNTER — Telehealth: Payer: Self-pay | Admitting: Licensed Clinical Social Worker

## 2023-01-12 ENCOUNTER — Other Ambulatory Visit (HOSPITAL_COMMUNITY)
Admission: RE | Admit: 2023-01-12 | Discharge: 2023-01-12 | Disposition: A | Discharge status: Home / Self Care | Payer: 59 | Source: Ambulatory Visit | Attending: Nephrology | Admitting: Nephrology

## 2023-01-12 DIAGNOSIS — C50412 Malignant neoplasm of upper-outer quadrant of left female breast: Secondary | ICD-10-CM | POA: Insufficient documentation

## 2023-01-12 DIAGNOSIS — D631 Anemia in chronic kidney disease: Secondary | ICD-10-CM | POA: Insufficient documentation

## 2023-01-12 DIAGNOSIS — N1832 Chronic kidney disease, stage 3b: Secondary | ICD-10-CM | POA: Insufficient documentation

## 2023-01-12 DIAGNOSIS — Z79899 Other long term (current) drug therapy: Secondary | ICD-10-CM | POA: Insufficient documentation

## 2023-01-12 DIAGNOSIS — R809 Proteinuria, unspecified: Secondary | ICD-10-CM | POA: Diagnosis not present

## 2023-01-12 DIAGNOSIS — E875 Hyperkalemia: Secondary | ICD-10-CM | POA: Diagnosis not present

## 2023-01-12 LAB — RENAL FUNCTION PANEL
Albumin: 4.1 g/dL (ref 3.5–5.0)
Anion gap: 13 (ref 5–15)
BUN: 30 mg/dL — ABNORMAL HIGH (ref 8–23)
CO2: 19 mmol/L — ABNORMAL LOW (ref 22–32)
Calcium: 8.8 mg/dL — ABNORMAL LOW (ref 8.9–10.3)
Chloride: 104 mmol/L (ref 98–111)
Creatinine, Ser: 1.5 mg/dL — ABNORMAL HIGH (ref 0.44–1.00)
GFR, Estimated: 34 mL/min — ABNORMAL LOW (ref >60)
Glucose, Bld: 95 mg/dL (ref 70–99)
Phosphorus: 3.9 mg/dL (ref 2.5–4.6)
Potassium: 5 mmol/L (ref 3.5–5.1)
Sodium: 136 mmol/L (ref 135–145)

## 2023-01-12 LAB — IRON AND TIBC
Iron: 98 ug/dL (ref 28–170)
Saturation Ratios: 34 % — ABNORMAL HIGH (ref 10.4–31.8)
TIBC: 290 ug/dL (ref 250–450)
UIBC: 192 ug/dL

## 2023-01-12 LAB — CREATININE, URINE, 24 HOUR
Collection Interval-UCRE24: 24 hours
Creatinine, 24H Ur: 672 mg/d (ref 600–1800)
Creatinine, Urine: 84 mg/dL
Urine Total Volume-UCRE24: 800 mL

## 2023-01-12 LAB — URIC ACID: Uric Acid, Serum: 5.6 mg/dL (ref 2.5–7.1)

## 2023-01-12 LAB — FOLATE: Folate: 29.7 ng/mL (ref >5.9)

## 2023-01-12 LAB — PROTEIN, URINE, 24 HOUR
Collection Interval-UPROT: 24 hours
Protein, 24H Urine: 136 mg/d — ABNORMAL HIGH (ref 50–100)
Protein, Urine: 17 mg/dL
Urine Total Volume-UPROT: 800 mL

## 2023-01-12 LAB — URINALYSIS, W/ REFLEX TO CULTURE (INFECTION SUSPECTED)
Bilirubin Urine: NEGATIVE
Glucose, UA: NEGATIVE mg/dL
Hgb urine dipstick: NEGATIVE
Ketones, ur: NEGATIVE mg/dL
Leukocytes,Ua: NEGATIVE
Nitrite: NEGATIVE
Protein, ur: NEGATIVE mg/dL
Specific Gravity, Urine: 1.014 (ref 1.005–1.030)
pH: 8 (ref 5.0–8.0)

## 2023-01-12 LAB — MAGNESIUM: Magnesium: 2.3 mg/dL (ref 1.7–2.4)

## 2023-01-12 LAB — VITAMIN B12: Vitamin B-12: 382 pg/mL (ref 180–914)

## 2023-01-12 LAB — FERRITIN: Ferritin: 118 ng/mL (ref 11–307)

## 2023-01-13 LAB — HEMOGLOBIN A1C
Hgb A1c MFr Bld: 5.7 % — ABNORMAL HIGH (ref 4.8–5.6)
Mean Plasma Glucose: 117 mg/dL

## 2023-01-13 LAB — MISC LABCORP TEST (SEND OUT): Labcorp test code: 141330

## 2023-01-13 LAB — PTH, INTACT AND CALCIUM
Calcium, Total (PTH): 9.1 mg/dL (ref 8.7–10.3)
PTH: 62 pg/mL (ref 15–65)

## 2023-01-13 LAB — HIV-1/HIV-2 QUAL RNA
HIV-1 RNA, Qualitative: NONREACTIVE
HIV-2 RNA, Qualitative: NONREACTIVE

## 2023-01-14 LAB — MISC LABCORP TEST (SEND OUT): Labcorp test code: 144000

## 2023-01-18 ENCOUNTER — Other Ambulatory Visit (HOSPITAL_COMMUNITY): Payer: Self-pay

## 2023-01-18 ENCOUNTER — Other Ambulatory Visit: Payer: Self-pay

## 2023-01-19 ENCOUNTER — Other Ambulatory Visit: Payer: Self-pay | Admitting: *Deleted

## 2023-01-19 ENCOUNTER — Other Ambulatory Visit (HOSPITAL_COMMUNITY): Payer: Self-pay

## 2023-01-19 MED ORDER — TRAMADOL HCL 50 MG PO TABS: 50.0000 mg | ORAL_TABLET | Freq: Two times a day (BID) | ORAL | 0 refills | Status: DC | PRN

## 2023-01-23 NOTE — Progress Notes (Signed)
Maria Hardy [address], [place] [address]    Clinic Day:  01/23/2023  Referring physician: Marylynn Pearson, FNP  Patient Care Team: Maria Pearson, FNP as PCP - General (Family Medicine)   ASSESSMENT & PLAN:   Assessment: 1.  Stage IV (T2 N0 G2, ER/PR+, HER2-) left breast UOQ IDC: - She noticed left breast mass in the first week of March. - Mammogram (10/21/2022): Irregular hypoechoic mass in the left breast at 12:30 position 4 CFN measuring 3.1 x 2.1 x 3 cm.  Mass extends to the level of skin.  There is a subtle shadowing mass in the left breast at the 12:30 position 7 CFN measuring 0.4 x 0.7 x 0.8 cm.  No axillary adenopathy. - Biopsy (11/02/2022): Grade 2 IDC, ER 70% moderate strong, PR 0%, HER2 1+, Ki-67 20%. - Ibrance 75 mg 3 weeks on/1 week off and anastrozole started on 12/03/2022.   2.  Left breast DCIS: - Biopsy (11/02/2022): DCIS, ER 90% strong staining, PR 0%   3.  Social/family history: - She is widowed 9 years ago and lives with her youngest son at home.  She does not drive but is independent of ADLs and some IADLs.  She worked on her tobacco form prior to retirement.  Current active smoker, 1 pack/day, started smoking at age 51. - Sister had breast cancer in her 1s.  Another sister had basal cell skin cancer.    Plan: 1.  Stage IV (T2 N0 G2, ER/PR+, HER2-) left breast UOQ IDC: - Reviewed PET scan images from 12/16/2022: 2.5 cm central left breast mass, thoracic nodal metastasis and tiny bilateral pulmonary nodules nonspecific.  Hypermetabolic left posterior thyroid nodule. - She started [place] on 12/03/2022.  She noticed mild fatigue but denied any major side effects. - Labs from 12/16/2022: White count is 3.3 with normal ANC.  LFTs are normal.  Creatinine is 1.72 and elevated from 11/10/2022. - She will continue Ibrance.  RTC 4 weeks for follow-up with repeat labs and CA 15-3.   2.  Osteoporosis (DEXA 09/15/2022 T-score -3): - We discussed about  starting her on Prolia 60 mg every 6 months.  No bone metastasis on PET scan. - We discussed side effects including rare chance of ONJ and hypocalcemia.  She will start taking calcium supplements.  Vitamin D level today is 58. - We will start first dose of Prolia today.   3.  CKD: - Referral to Dr. Wolfgang Phoenix sent.   4.  Right hip and right knee pain: - She has severe arthritis of the right hip. - Will give her tramadol 50 mg 1-2 times daily as needed.  I have warned her of drowsiness and constipation.    No orders of the defined types were placed in this encounter.     I,Maria Hardy,acting as a Neurosurgeon for Maria Massed, MD.,have documented all relevant documentation on the behalf of Maria Massed, MD,as directed by  Maria Massed, MD while in the presence of Maria Massed, MD.   ***  Maria Hardy   6/9/202410:38 PM  CHIEF COMPLAINT:   Diagnosis: left breast carcinoma   Cancer Staging  Breast cancer of upper-outer quadrant of left female breast [facility]) Staging form: Breast, AJCC 8th Edition - Clinical stage from 11/10/2022: Stage IV (cT2, cN0, cM1, G2, ER+, PR-, HER2-) - Unsigned  Ductal carcinoma in situ (DCIS) of left breast Staging form: Breast, AJCC 8th Edition - Clinical stage from 11/10/2022: Stage 0 (cTis (DCIS), cN0, cM0, ER+,  PR-, HER2: Not Assessed) - Unsigned    Prior Therapy: none  Current Therapy:  Anastrozole and Ibrance    HISTORY OF PRESENT ILLNESS:   Oncology History   No history exists.     INTERVAL HISTORY:   Maria Hardy is a 82 y.o. female presenting to clinic today for follow up of left breast carcinoma. She was last seen by me on 12/22/22.  Today, she states that she is doing well overall. Her appetite level is at ***%. Her energy level is at ***%.  PAST MEDICAL HISTORY:   Past Medical History: 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 breast, diagnosed  10/2022   Hypertension    Osteoporosis    Scoliosis    Vertigo     Surgical History: Past Surgical History:  Procedure Laterality Date   BREAST BIOPSY Left 11/02/2022   [place] LT BREAST BX W LOC DEV 1ST LESION IMG BX SPEC US GUIDE 11/02/2022 [facility]-ULTRASOUND   BREAST BIOPSY Left 11/02/2022   [place] LT BREAST BX W LOC DEV EA ADD LESION IMG BX SPEC US GUIDE 11/02/2022 AP-ULTRASOUND   thyroid tumopr remo N/A    THYROIDECTOMY      Social History: Social History   Socioeconomic History   Marital status: Widowed    Spouse name: Not on file   Number of children: Not on file   Years of education: Not on file   Highest education level: Not on file  Occupational History   Occupation: owned tobacco farm  Tobacco Use   Smoking status: Every Day    Packs/day: 1.00    Years: 58.00    Additional pack years: 0.00    Total pack years: 58.00    Types: Cigarettes   Smokeless tobacco: Never  Vaping Use   Vaping Use: Never used  Substance and Sexual Activity   Alcohol use: No   Drug use: No   Sexual activity: Not on file  Other Topics Concern   Not on file  Social History Narrative   Not on file   Social Determinants of Health   Financial Resource Strain: Not on file  Food Insecurity: No Food Insecurity (11/10/2022)   Hunger Vital Sign    Worried About Running Out of Food in the Last Year: Never true    Ran Out of Food in the Last Year: Never true  Transportation Needs: No Transportation Needs (11/10/2022)   PRAPARE - Administrator, Civil Service (Medical): No    Lack of Transportation (Non-Medical): No  Physical Activity: Not on file  Stress: Not on file  Social Connections: Not on file  Intimate Partner Violence: Not At Risk (11/10/2022)   Humiliation, Afraid, Rape, and Kick questionnaire    Fear of Current or Ex-Partner: No    Emotionally Abused: No    Physically Abused: No    Sexually Abused: No    Family History: Family History  Problem Relation Age of Onset   Breast  cancer Sister 70 - 7   Basal cell carcinoma Sister    Leukemia Son 3    Current Medications:  Current Outpatient Medications:    acetaminophen (TYLENOL) 500 MG tablet, Take 500 mg by mouth every 6 (six) hours as needed (pain.)., Disp: , Rfl:    amLODipine (NORVASC) 10 MG tablet, Take 10 mg by mouth every evening., Disp: , Rfl:    anastrozole (ARIMIDEX) 1 MG tablet, Take 1  tablet (1 mg total) by mouth daily. (Patient taking differently: Take 1 mg by mouth every evening.), Disp: 30 tablet, Rfl: 6   Ascorbic Acid (VITAMIN C PO), Take 1 tablet by mouth in the morning., Disp: , Rfl:    Cholecalciferol (VITAMIN D-3 PO), Take 1 tablet by mouth in the morning., Disp: , Rfl:    diclofenac (VOLTAREN) 75 MG EC tablet, Take 1 tablet (75 mg total) by mouth 2 (two) times daily with a meal., Disp: 60 tablet, Rfl: 2   dicyclomine (BENTYL) 10 MG capsule, Take 10 mg by mouth 3 (three) times daily., Disp: , Rfl:    gabapentin (NEURONTIN) 100 MG capsule, Take 100 mg by mouth 2 (two) times daily., Disp: , Rfl:    hydrOXYzine (ATARAX) 25 MG tablet, Take 25 mg by mouth 3 (three) times daily., Disp: , Rfl:    losartan (COZAAR) 100 MG tablet, Take 100 mg by mouth in the morning., Disp: , Rfl:    Magnesium Oxide (MAG-OX PO), Take 1 tablet by mouth in the morning., Disp: , Rfl:    meclizine (ANTIVERT) 25 MG tablet, Take 25 mg by mouth 3 (three) times daily as needed for dizziness., Disp: , Rfl:    metoprolol tartrate (LOPRESSOR) 25 MG tablet, Take 25 mg by mouth 2 (two) times daily., Disp: , Rfl:    Multiple Vitamin (MULTIVITAMIN WITH MINERALS) TABS tablet, Take 1 tablet by mouth in the morning., Disp: , Rfl:    naproxen sodium (ALEVE) 220 MG tablet, Take 220 mg by mouth daily as needed (pain.)., Disp: , Rfl:    Omega-3 Fatty Acids (FISH OIL PO), Take 1 capsule by mouth in the morning., Disp: , Rfl:    ondansetron (ZOFRAN ODT) 4 MG disintegrating tablet, Take 1 tablet (4 mg total) by mouth every 8 (eight) hours as  needed., Disp: 10 tablet, Rfl: 1   palbociclib (IBRANCE) 75 MG tablet, Take 1 tablet (75 mg total) by mouth daily. Take for 21 days on, 7 days off, repeat every 28 days., Disp: 21 tablet, Rfl: 2   traMADol (ULTRAM) 50 MG tablet, Take 1 tablet (50 mg total) by mouth every 12 (twelve) hours as needed., Disp: 60 tablet, Rfl: 0   Allergies: No Known Allergies  REVIEW OF SYSTEMS:   Review of Systems  Constitutional:  Negative for chills, fatigue and fever.  HENT:   Negative for lump/mass, mouth sores, nosebleeds, sore throat and trouble swallowing.   Eyes:  Negative for eye problems.  Respiratory:  Negative for cough and shortness of breath.   Cardiovascular:  Negative for chest pain, leg swelling and palpitations.  Gastrointestinal:  Negative for abdominal pain, constipation, diarrhea, nausea and vomiting.  Genitourinary:  Negative for bladder incontinence, difficulty urinating, dysuria, frequency, hematuria and nocturia.   Musculoskeletal:  Negative for arthralgias, back pain, flank pain, myalgias and neck pain.  Skin:  Negative for itching and rash.  Neurological:  Negative for dizziness, headaches and numbness.  Hematological:  Does not bruise/bleed easily.  Psychiatric/Behavioral:  Negative for depression, sleep disturbance and suicidal ideas. The patient is not nervous/anxious.   All other systems reviewed and are negative.    VITALS:   There were no vitals taken for this visit.  Wt Readings from Last 3 Encounters:  12/22/22 169 lb 11.2 oz (77 kg)  11/29/22 171 lb 3.2 oz (77.7 kg)  11/23/22 172 lb (78 kg)    There is no height or weight on file to calculate BMI.  Performance status (ECOG): {CHL  ONC ZO:[id number]}  PHYSICAL EXAM:   Physical Exam Vitals and nursing note reviewed. Exam conducted with a chaperone present.  Constitutional:      Appearance: Normal appearance.  Cardiovascular:     Rate and Rhythm: Normal rate and regular rhythm.     Pulses: Normal pulses.      Heart sounds: Normal heart sounds.  Pulmonary:     Effort: Pulmonary effort is normal.     Breath sounds: Normal breath sounds.  Abdominal:     Palpations: Abdomen is soft. There is no hepatomegaly, splenomegaly or mass.     Tenderness: There is no abdominal tenderness.  Musculoskeletal:     Right lower leg: No edema.     Left lower leg: No edema.  Lymphadenopathy:     Cervical: No cervical adenopathy.     Right cervical: No superficial, deep or posterior cervical adenopathy.    Left cervical: No superficial, deep or posterior cervical adenopathy.     Upper Body:     Right upper body: No supraclavicular or axillary adenopathy.     Left upper body: No supraclavicular or axillary adenopathy.  Neurological:     General: No focal deficit present.     Mental Status: She is alert and oriented to person, place, and time.  Psychiatric:        Mood and Affect: Mood normal.        Behavior: Behavior normal.     LABS:      Latest Ref Rng & Units 12/16/2022    8:48 AM 11/10/2022    9:08 AM 06/23/2020    8:37 AM  CBC  WBC 4.0 - 10.5 K/uL 3.3  8.0  11.3   Hemoglobin 12.0 - 15.0 g/dL 81.1  91.4  78.2   Hematocrit 36.0 - 46.0 % 36.4  39.2  36.8   Platelets 150 - 400 K/uL 227  268  316       Latest Ref Rng & Units 01/12/2023    1:17 PM 12/16/2022    8:48 AM 11/10/2022    9:08 AM  CMP  Glucose 70 - 99 mg/dL 95  956  213   BUN 8 - 23 mg/dL 30  50  39   Creatinine 0.44 - 1.00 mg/dL 0.86  5.78  4.69   Sodium 135 - 145 mmol/L 136  138  138   Potassium 3.5 - 5.1 mmol/L 5.0  5.0  5.4   Chloride 98 - 111 mmol/L 104  107  106   CO2 22 - 32 mmol/L 19  23  24    Calcium 8.9 - 10.3 mg/dL 8.7 - 62.9 mg/dL 8.8    9.1  9.4  9.5   Total Protein 6.5 - 8.1 g/dL  7.6  7.7   Total Bilirubin 0.3 - 1.2 mg/dL  0.8  0.9   Alkaline Phos 38 - 126 U/L  86  99   AST 15 - 41 U/L  15  18   ALT 0 - 44 U/L  12  15      No results found for: "CEA1", "CEA" / No results found for: "CEA1", "CEA" No results found  for: "PSA1" No results found for: "BMW413" No results found for: "CAN125"  No results found for: "TOTALPROTELP", "ALBUMINELP", "A1GS", "A2GS", "BETS", "BETA2SER", "GAMS", "MSPIKE", "SPEI" Lab Results  Component Value Date   TIBC 290 01/12/2023   FERRITIN 118 01/12/2023   IRONPCTSAT 34 (H) 01/12/2023   No results found for: "LDH"   STUDIES:  No results found.

## 2023-01-24 ENCOUNTER — Inpatient Hospital Stay: Payer: 59 | Attending: Hematology

## 2023-01-24 ENCOUNTER — Inpatient Hospital Stay (HOSPITAL_BASED_OUTPATIENT_CLINIC_OR_DEPARTMENT_OTHER): Payer: 59 | Admitting: Hematology

## 2023-01-24 ENCOUNTER — Other Ambulatory Visit: Payer: Self-pay

## 2023-01-24 ENCOUNTER — Other Ambulatory Visit (HOSPITAL_COMMUNITY): Payer: Self-pay

## 2023-01-24 DIAGNOSIS — M25561 Pain in right knee: Secondary | ICD-10-CM | POA: Insufficient documentation

## 2023-01-24 DIAGNOSIS — C50412 Malignant neoplasm of upper-outer quadrant of left female breast: Secondary | ICD-10-CM

## 2023-01-24 DIAGNOSIS — Z79899 Other long term (current) drug therapy: Secondary | ICD-10-CM | POA: Insufficient documentation

## 2023-01-24 DIAGNOSIS — Z170 Estrogen receptor positive status [ER+]: Secondary | ICD-10-CM

## 2023-01-24 DIAGNOSIS — D649 Anemia, unspecified: Secondary | ICD-10-CM | POA: Diagnosis not present

## 2023-01-24 DIAGNOSIS — Z808 Family history of malignant neoplasm of other organs or systems: Secondary | ICD-10-CM | POA: Insufficient documentation

## 2023-01-24 DIAGNOSIS — N189 Chronic kidney disease, unspecified: Secondary | ICD-10-CM | POA: Diagnosis not present

## 2023-01-24 DIAGNOSIS — M1611 Unilateral primary osteoarthritis, right hip: Secondary | ICD-10-CM | POA: Diagnosis not present

## 2023-01-24 DIAGNOSIS — Z79811 Long term (current) use of aromatase inhibitors: Secondary | ICD-10-CM | POA: Insufficient documentation

## 2023-01-24 DIAGNOSIS — C771 Secondary and unspecified malignant neoplasm of intrathoracic lymph nodes: Secondary | ICD-10-CM | POA: Diagnosis not present

## 2023-01-24 DIAGNOSIS — Z803 Family history of malignant neoplasm of breast: Secondary | ICD-10-CM | POA: Diagnosis not present

## 2023-01-24 DIAGNOSIS — F17210 Nicotine dependence, cigarettes, uncomplicated: Secondary | ICD-10-CM | POA: Insufficient documentation

## 2023-01-24 DIAGNOSIS — M810 Age-related osteoporosis without current pathological fracture: Secondary | ICD-10-CM | POA: Insufficient documentation

## 2023-01-24 LAB — CBC WITH DIFFERENTIAL/PLATELET
Abs Immature Granulocytes: 0.02 10*3/uL (ref 0.00–0.07)
Basophils Absolute: 0 10*3/uL (ref 0.0–0.1)
Basophils Relative: 1 %
Eosinophils Absolute: 0 10*3/uL (ref 0.0–0.5)
Eosinophils Relative: 1 %
HCT: 32.1 % — ABNORMAL LOW (ref 36.0–46.0)
Hemoglobin: 10.6 g/dL — ABNORMAL LOW (ref 12.0–15.0)
Immature Granulocytes: 1 %
Lymphocytes Relative: 30 %
Lymphs Abs: 1.2 10*3/uL (ref 0.7–4.0)
MCH: 31.7 pg (ref 26.0–34.0)
MCHC: 33 g/dL (ref 30.0–36.0)
MCV: 96.1 fL (ref 80.0–100.0)
Monocytes Absolute: 0.3 10*3/uL (ref 0.1–1.0)
Monocytes Relative: 8 %
Neutro Abs: 2.4 10*3/uL (ref 1.7–7.7)
Neutrophils Relative %: 59 %
Platelets: 209 10*3/uL (ref 150–400)
RBC: 3.34 MIL/uL — ABNORMAL LOW (ref 3.87–5.11)
RDW: 16.4 % — ABNORMAL HIGH (ref 11.5–15.5)
WBC: 4 10*3/uL (ref 4.0–10.5)
nRBC: 0 % (ref 0.0–0.2)

## 2023-01-24 LAB — COMPREHENSIVE METABOLIC PANEL
ALT: 11 U/L (ref 0–44)
AST: 16 U/L (ref 15–41)
Albumin: 4.1 g/dL (ref 3.5–5.0)
Alkaline Phosphatase: 64 U/L (ref 38–126)
Anion gap: 7 (ref 5–15)
BUN: 28 mg/dL — ABNORMAL HIGH (ref 8–23)
CO2: 24 mmol/L (ref 22–32)
Calcium: 8.6 mg/dL — ABNORMAL LOW (ref 8.9–10.3)
Chloride: 104 mmol/L (ref 98–111)
Creatinine, Ser: 1.29 mg/dL — ABNORMAL HIGH (ref 0.44–1.00)
GFR, Estimated: 41 mL/min — ABNORMAL LOW (ref >60)
Glucose, Bld: 96 mg/dL (ref 70–99)
Potassium: 4.6 mmol/L (ref 3.5–5.1)
Sodium: 135 mmol/L (ref 135–145)
Total Bilirubin: 0.8 mg/dL (ref 0.3–1.2)
Total Protein: 7.5 g/dL (ref 6.5–8.1)

## 2023-01-24 MED ORDER — PALBOCICLIB 100 MG PO TABS
100.0000 mg | ORAL_TABLET | Freq: Every day | ORAL | 1 refills | Status: DC
Start: 2023-01-24 — End: 2023-02-21
  Filled 2023-01-24: qty 21, 28d supply, fill #0
  Filled 2023-02-14: qty 21, 28d supply, fill #1

## 2023-01-24 NOTE — Patient Instructions (Signed)
[facility]  Discharge Instructions  You were seen and examined today by Dr. Ellin Saba.  Dr. Ellin Saba discussed your most recent lab work which revealed that everything looks good.  Start taking the Ibrance 100 mg when you get it.  Follow-up as scheduled in 4 weeks.    Thank you for choosing [facility] to provide your oncology and hematology care.   To afford each patient quality time with our provider, please arrive at least 15 minutes before your scheduled appointment time. You may need to reschedule your appointment if you arrive late (10 or more minutes). Arriving late affects you and other patients whose appointments are after yours.  Also, if you miss three or more appointments without notifying the office, you may be dismissed from the clinic at the provider's discretion.    Again, thank you for choosing [facility].  Our hope is that these requests will decrease the amount of time that you wait before being seen by our physicians.   If you have a lab appointment with the Cancer Center - please note that after April 8th, all labs will be drawn in the cancer center.  You do not have to check in or register with the main entrance as you have in the past but will complete your check-in at the cancer center.            _____________________________________________________________  Should you have questions after your visit to [facility], please contact our office at [phone] and follow the prompts.  Our office hours are 8:00 a.m. to 4:30 p.m. Monday - Thursday and 8:00 a.m. to 2:30 p.m. Friday.  Please note that voicemails left after 4:00 p.m. may not be returned until the following business day.  We are closed weekends and all major holidays.  You do have access to a nurse 24-7, just call the main number to the clinic [phone] and do not press any options, hold on the line and a nurse will  answer the phone.    For prescription refill requests, have your pharmacy contact our office and allow 72 hours.    Masks are no longer required in the cancer centers. If you would like for your care team to wear a mask while they are taking care of you, please let them know. You may have one support person who is at least 83 years old accompany you for your appointments.

## 2023-01-26 ENCOUNTER — Other Ambulatory Visit (HOSPITAL_COMMUNITY): Payer: Self-pay

## 2023-01-26 LAB — CANCER ANTIGEN 15-3: CA 15-3: 31.4 U/mL — ABNORMAL HIGH (ref 0.0–25.0)

## 2023-02-07 ENCOUNTER — Encounter: Payer: 59 | Admitting: Licensed Clinical Social Worker

## 2023-02-14 ENCOUNTER — Other Ambulatory Visit (HOSPITAL_COMMUNITY): Payer: Self-pay

## 2023-02-14 ENCOUNTER — Inpatient Hospital Stay: Payer: 59 | Attending: Hematology

## 2023-02-14 DIAGNOSIS — Z170 Estrogen receptor positive status [ER+]: Secondary | ICD-10-CM | POA: Insufficient documentation

## 2023-02-14 DIAGNOSIS — C771 Secondary and unspecified malignant neoplasm of intrathoracic lymph nodes: Secondary | ICD-10-CM | POA: Insufficient documentation

## 2023-02-14 DIAGNOSIS — M1611 Unilateral primary osteoarthritis, right hip: Secondary | ICD-10-CM | POA: Diagnosis not present

## 2023-02-14 DIAGNOSIS — M25561 Pain in right knee: Secondary | ICD-10-CM | POA: Insufficient documentation

## 2023-02-14 DIAGNOSIS — M810 Age-related osteoporosis without current pathological fracture: Secondary | ICD-10-CM | POA: Insufficient documentation

## 2023-02-14 DIAGNOSIS — Z79811 Long term (current) use of aromatase inhibitors: Secondary | ICD-10-CM | POA: Insufficient documentation

## 2023-02-14 DIAGNOSIS — R918 Other nonspecific abnormal finding of lung field: Secondary | ICD-10-CM | POA: Diagnosis not present

## 2023-02-14 DIAGNOSIS — E041 Nontoxic single thyroid nodule: Secondary | ICD-10-CM | POA: Diagnosis not present

## 2023-02-14 DIAGNOSIS — N189 Chronic kidney disease, unspecified: Secondary | ICD-10-CM | POA: Insufficient documentation

## 2023-02-14 DIAGNOSIS — C50412 Malignant neoplasm of upper-outer quadrant of left female breast: Secondary | ICD-10-CM | POA: Insufficient documentation

## 2023-02-14 LAB — COMPREHENSIVE METABOLIC PANEL
ALT: 10 U/L (ref 0–44)
AST: 15 U/L (ref 15–41)
Albumin: 3.7 g/dL (ref 3.5–5.0)
Alkaline Phosphatase: 56 U/L (ref 38–126)
Anion gap: 7 (ref 5–15)
BUN: 25 mg/dL — ABNORMAL HIGH (ref 8–23)
CO2: 24 mmol/L (ref 22–32)
Calcium: 8.7 mg/dL — ABNORMAL LOW (ref 8.9–10.3)
Chloride: 105 mmol/L (ref 98–111)
Creatinine, Ser: 1.56 mg/dL — ABNORMAL HIGH (ref 0.44–1.00)
GFR, Estimated: 33 mL/min — ABNORMAL LOW (ref >60)
Glucose, Bld: 111 mg/dL — ABNORMAL HIGH (ref 70–99)
Potassium: 4.9 mmol/L (ref 3.5–5.1)
Sodium: 136 mmol/L (ref 135–145)
Total Bilirubin: 0.6 mg/dL (ref 0.3–1.2)
Total Protein: 7 g/dL (ref 6.5–8.1)

## 2023-02-14 LAB — CBC WITH DIFFERENTIAL/PLATELET
Abs Immature Granulocytes: 0.02 10*3/uL (ref 0.00–0.07)
Basophils Absolute: 0 10*3/uL (ref 0.0–0.1)
Basophils Relative: 1 %
Eosinophils Absolute: 0 10*3/uL (ref 0.0–0.5)
Eosinophils Relative: 1 %
HCT: 33.4 % — ABNORMAL LOW (ref 36.0–46.0)
Hemoglobin: 11.1 g/dL — ABNORMAL LOW (ref 12.0–15.0)
Immature Granulocytes: 1 %
Lymphocytes Relative: 30 %
Lymphs Abs: 0.9 10*3/uL (ref 0.7–4.0)
MCH: 32.8 pg (ref 26.0–34.0)
MCHC: 33.2 g/dL (ref 30.0–36.0)
MCV: 98.8 fL (ref 80.0–100.0)
Monocytes Absolute: 0.2 10*3/uL (ref 0.1–1.0)
Monocytes Relative: 6 %
Neutro Abs: 1.9 10*3/uL (ref 1.7–7.7)
Neutrophils Relative %: 61 %
Platelets: 196 10*3/uL (ref 150–400)
RBC: 3.38 MIL/uL — ABNORMAL LOW (ref 3.87–5.11)
RDW: 17.1 % — ABNORMAL HIGH (ref 11.5–15.5)
WBC: 3.1 10*3/uL — ABNORMAL LOW (ref 4.0–10.5)
nRBC: 0 % (ref 0.0–0.2)

## 2023-02-15 LAB — CANCER ANTIGEN 27.29: CA 27.29: 60.1 U/mL — ABNORMAL HIGH (ref 0.0–38.6)

## 2023-02-15 LAB — CANCER ANTIGEN 15-3: CA 15-3: 31.7 U/mL — ABNORMAL HIGH (ref 0.0–25.0)

## 2023-02-21 ENCOUNTER — Inpatient Hospital Stay (HOSPITAL_BASED_OUTPATIENT_CLINIC_OR_DEPARTMENT_OTHER): Payer: 59 | Admitting: Hematology

## 2023-02-21 ENCOUNTER — Other Ambulatory Visit: Payer: Self-pay

## 2023-02-21 ENCOUNTER — Other Ambulatory Visit (HOSPITAL_COMMUNITY): Payer: Self-pay

## 2023-02-21 DIAGNOSIS — Z170 Estrogen receptor positive status [ER+]: Secondary | ICD-10-CM

## 2023-02-21 DIAGNOSIS — C50412 Malignant neoplasm of upper-outer quadrant of left female breast: Secondary | ICD-10-CM | POA: Diagnosis not present

## 2023-02-21 MED ORDER — PALBOCICLIB 100 MG PO TABS
100.0000 mg | ORAL_TABLET | Freq: Every day | ORAL | 1 refills | Status: DC
Start: 2023-02-21 — End: 2023-04-14
  Filled 2023-02-21: qty 21, 28d supply, fill #0
  Filled 2023-03-10: qty 21, 28d supply, fill #1

## 2023-02-21 NOTE — Patient Instructions (Addendum)
[facility] - [facility]  Discharge Instructions  You were seen and examined today by Dr. Ellin Saba.  Dr. Ellin Saba discussed your most recent lab work which revealed that everything looks good and stable.  Continue taking the Ibrance, Anastrozole, Vitamin D and Calcium as prescribed.  Follow-up as scheduled in 2 months.    Thank you for choosing [facility] to provide your oncology and hematology care.   To afford each patient quality time with our provider, please arrive at least 15 minutes before your scheduled appointment time. You may need to reschedule your appointment if you arrive late (10 or more minutes). Arriving late affects you and other patients whose appointments are after yours.  Also, if you miss three or more appointments without notifying the office, you may be dismissed from the clinic at the provider's discretion.    Again, thank you for choosing [facility].  Our hope is that these requests will decrease the amount of time that you wait before being seen by our physicians.   If you have a lab appointment with the Cancer Center - please note that after April 8th, all labs will be drawn in the cancer center.  You do not have to check in or register with the main entrance as you have in the past but will complete your check-in at the cancer center.            _____________________________________________________________  Should you have questions after your visit to [facility], please contact our office at [phone] and follow the prompts.  Our office hours are 8:00 a.m. to 4:30 p.m. Monday - Thursday and 8:00 a.m. to 2:30 p.m. Friday.  Please note that voicemails left after 4:00 p.m. may not be returned until the following business day.  We are closed weekends and all major holidays.  You do have access to a nurse 24-7, just call the main number to the clinic [phone] and do not press any  options, hold on the line and a nurse will answer the phone.    For prescription refill requests, have your pharmacy contact our office and allow 72 hours.    Masks are no longer required in the cancer centers. If you would like for your care team to wear a mask while they are taking care of you, please let them know. You may have one support person who is at least 83 years old accompany you for your appointments.

## 2023-02-21 NOTE — Progress Notes (Signed)
[facility] [address], [place] [address]    Clinic Day:  02/21/2023  Referring physician: Marylynn Pearson, FNP  Patient Care Team: Marylynn Pearson, FNP as PCP - General (Family Medicine)   ASSESSMENT & PLAN:   Assessment: 1.  Stage IV (T2 N0 G2, ER/PR+, HER2-) left breast UOQ IDC: - She noticed left breast mass in the first week of March. - Mammogram (10/21/2022): Irregular hypoechoic mass in the left breast at 12:30 position 4 CFN measuring 3.1 x 2.1 x 3 cm.  Mass extends to the level of skin.  There is a subtle shadowing mass in the left breast at the 12:30 position 7 CFN measuring 0.4 x 0.7 x 0.8 cm.  No axillary adenopathy. - Biopsy (11/02/2022): Grade 2 IDC, ER 70% moderate strong, PR 0%, HER2 1+, Ki-67 20%. - Ibrance 75 mg 3 weeks on/1 week off and anastrozole started on 12/03/2022.   2.  Left breast DCIS: - Biopsy (11/02/2022): DCIS, ER 90% strong staining, PR 0%   3.  Social/family history: - She is widowed 9 years ago and lives with her youngest son at home.  She does not drive but is independent of ADLs and some IADLs.  She worked on her tobacco form prior to retirement.  Current active smoker, 1 pack/day, started smoking at age 39. - Sister had breast cancer in her 37s.  Another sister had basal cell skin cancer.    Plan: 1.  Stage IV (T2 N0 G2, ER/PR+, HER2-) left breast UOQ IDC: - PET scan (12/16/2022): 2.5 cm central left breast mass, thoracic nodal metastasis and tiny bilateral lung nodules nonspecific.  Hypermetabolic left posterior thyroid nodule. - She is tolerating Ibrance and anastrozole reasonably well. - Has mild fatigue.  No significant hot flashes. - Reviewed labs today: Normal LFTs.  Creatinine 1.56 and stable.  CBC shows mild leukopenia with white count 3.1 with normal ANC.  Platelet count normal.  Hemoglobin 11.1.  CA 15-3 is 31.7 and CA 27-29 is 60 and stable. - Continue Ibrance 100 mg 3 weeks on/1 week off.  Continue anastrozole 1 mg  daily.  She will restart next cycle of Ibrance on 02/25/2023. - Recommend follow-up in 2 months.  I plan to repeat PET CT scan and tumor markers prior to next visit.   2.  Osteoporosis (DEXA 09/15/2022 T-score -3): - Received first dose of Prolia on 12/30/2022.  No side effects noted. - Continue calcium and vitamin D supplements.  Calcium today is 8.7.  Next dose of Prolia in November.   3.  CKD: - Latest creatinine is 1.56.  Continue follow-up with Dr. Wolfgang Phoenix.   4.  Right hip and right knee pain: - She has severe arthritis of the right hip.  Continue tramadol 50 mg in the morning as needed.    Orders Placed This Encounter  Procedures   NM PET Image Restag (PS) Skull Base To Thigh    Standing Status:   Future    Standing Expiration Date:   02/21/2024    Order Specific Question:   If indicated for the ordered procedure, I authorize the administration of a radiopharmaceutical per Radiology protocol    Answer:   Yes    Order Specific Question:   Preferred imaging location?    Answer:   [facility]    Order Specific Question:   Release to patient    Answer:   Immediate   Cancer antigen 15-3    Standing Status:  Future    Standing Expiration Date:   02/21/2024   Cancer antigen 27.29    Standing Status:   Future    Standing Expiration Date:   02/21/2024   CBC with Differential/Platelet    Standing Status:   Future    Standing Expiration Date:   02/21/2024    Order Specific Question:   Release to patient    Answer:   Immediate   Comprehensive metabolic panel    Standing Status:   Future    Standing Expiration Date:   02/21/2024    Order Specific Question:   Release to patient    Answer:   Immediate      I,Helena R Teague,acting as a scribe for Doreatha Massed, MD.,have documented all relevant documentation on the behalf of Doreatha Massed, MD,as directed by  Doreatha Massed, MD while in the presence of Doreatha Massed, MD.  I, Doreatha Massed MD, have reviewed the  above documentation for accuracy and completeness, and I agree with the above.    Doreatha Massed, MD   7/8/20244:30 PM  CHIEF COMPLAINT:   Diagnosis: left breast carcinoma    Cancer Staging  Breast cancer of upper-outer quadrant of left female breast [facility]) Staging form: Breast, AJCC 8th Edition - Clinical stage from 11/10/2022: Stage IV (cT2, cN0, cM1, G2, ER+, PR-, HER2-) - Unsigned  Ductal carcinoma in situ (DCIS) of left breast Staging form: Breast, AJCC 8th Edition - Clinical stage from 11/10/2022: Stage 0 (cTis (DCIS), cN0, cM0, ER+, PR-, HER2: Not Assessed) - Unsigned    Prior Therapy: none  Current Therapy:  Anastrozole and Ibrance    HISTORY OF PRESENT ILLNESS:   Oncology History   No history exists.     INTERVAL HISTORY:   Maria Hardy is a 82 y.o. female presenting to clinic today for follow up of left breast carcinoma. She was last seen by me on 01/24/23.  Today, she states that she is doing well overall. Her appetite level is at 50%. Her energy level is at 50%. Patient is accompanied by family.   She denies any new pains or toxicities from treatment. She notes that Arimidex 1mg  makes her fatigued. She reports that she is compliant with all her medications. She notes she is taking calcium and Vitamin D supplements.   She denies any nausea or vomiting. She reports loss of taste for all foods since starting treatment.   PAST MEDICAL HISTORY:   Past Medical History: 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 breast, diagnosed 10/2022   Hypertension    Osteoporosis    Scoliosis    Vertigo     Surgical History: Past Surgical History:  Procedure Laterality Date   BREAST BIOPSY Left 11/02/2022   [place] LT BREAST BX W LOC DEV 1ST LESION IMG BX SPEC US GUIDE 11/02/2022 [facility]-ULTRASOUND   BREAST BIOPSY Left 11/02/2022   [place] LT BREAST BX W LOC DEV EA ADD LESION IMG BX SPEC US GUIDE 11/02/2022 [facility]-ULTRASOUND    thyroid tumopr remo N/A    THYROIDECTOMY      Social History: Social History   Socioeconomic History   Marital status: Widowed    Spouse name: Not on file   Number of children: Not on file   Years of education: Not on file   Highest education level: Not on file  Occupational History   Occupation: owned tobacco farm  Tobacco Use   Smoking status:  Every Day    Packs/day: 1.00    Years: 58.00    Additional pack years: 0.00    Total pack years: 58.00    Types: Cigarettes   Smokeless tobacco: Never  Vaping Use   Vaping Use: Never used  Substance and Sexual Activity   Alcohol use: No   Drug use: No   Sexual activity: Not on file  Other Topics Concern   Not on file  Social History Narrative   Not on file   Social Determinants of Health   Financial Resource Strain: Not on file  Food Insecurity: No Food Insecurity (11/10/2022)   Hunger Vital Sign    Worried About Running Out of Food in the Last Year: Never true    Ran Out of Food in the Last Year: Never true  Transportation Needs: No Transportation Needs (11/10/2022)   PRAPARE - Administrator, Civil Service (Medical): No    Lack of Transportation (Non-Medical): No  Physical Activity: Not on file  Stress: Not on file  Social Connections: Not on file  Intimate Partner Violence: Not At Risk (11/10/2022)   Humiliation, Afraid, Rape, and Kick questionnaire    Fear of Current or Ex-Partner: No    Emotionally Abused: No    Physically Abused: No    Sexually Abused: No    Family History: Family History  Problem Relation Age of Onset   Breast cancer Sister 81 - 13   Basal cell carcinoma Sister    Leukemia Son 3    Current Medications:  Current Outpatient Medications:    acetaminophen (TYLENOL) 500 MG tablet, Take 500 mg by mouth every 6 (six) hours as needed (pain.)., Disp: , Rfl:    amLODipine (NORVASC) 10 MG tablet, Take 10 mg by mouth every evening., Disp: , Rfl:    anastrozole (ARIMIDEX) 1 MG tablet,  Take 1 tablet (1 mg total) by mouth daily. (Patient taking differently: Take 1 mg by mouth every evening.), Disp: 30 tablet, Rfl: 6   Ascorbic Acid (VITAMIN C PO), Take 1 tablet by mouth in the morning., Disp: , Rfl:    Cholecalciferol (VITAMIN D-3 PO), Take 1 tablet by mouth in the morning., Disp: , Rfl:    diclofenac (VOLTAREN) 75 MG EC tablet, Take 1 tablet (75 mg total) by mouth 2 (two) times daily with a meal., Disp: 60 tablet, Rfl: 2   dicyclomine (BENTYL) 10 MG capsule, Take 10 mg by mouth 3 (three) times daily., Disp: , Rfl:    gabapentin (NEURONTIN) 100 MG capsule, Take 100 mg by mouth 2 (two) times daily., Disp: , Rfl:    hydrochlorothiazide (HYDRODIURIL) 12.5 MG tablet, Take 12.5 mg by mouth every morning., Disp: , Rfl:    hydrOXYzine (ATARAX) 25 MG tablet, Take 25 mg by mouth 3 (three) times daily., Disp: , Rfl:    losartan (COZAAR) 100 MG tablet, Take 100 mg by mouth in the morning., Disp: , Rfl:    Magnesium Oxide (MAG-OX PO), Take 1 tablet by mouth in the morning., Disp: , Rfl:    meclizine (ANTIVERT) 25 MG tablet, Take 25 mg by mouth 3 (three) times daily as needed for dizziness., Disp: , Rfl:    metoprolol tartrate (LOPRESSOR) 25 MG tablet, Take 25 mg by mouth 2 (two) times daily., Disp: , Rfl:    Multiple Vitamin (MULTIVITAMIN WITH MINERALS) TABS tablet, Take 1 tablet by mouth in the morning., Disp: , Rfl:    naproxen sodium (ALEVE) 220 MG tablet, Take 220 mg  by mouth daily as needed (pain.)., Disp: , Rfl:    Omega-3 Fatty Acids (FISH OIL PO), Take 1 capsule by mouth in the morning., Disp: , Rfl:    ondansetron (ZOFRAN ODT) 4 MG disintegrating tablet, Take 1 tablet (4 mg total) by mouth every 8 (eight) hours as needed., Disp: 10 tablet, Rfl: 1   traMADol (ULTRAM) 50 MG tablet, Take 1 tablet (50 mg total) by mouth every 12 (twelve) hours as needed., Disp: 60 tablet, Rfl: 0   palbociclib (IBRANCE) 100 MG tablet, Take 1 tablet (100 mg total) by mouth daily. Take for 21 days on, 7 days  off, repeat every 28 days., Disp: 30 tablet, Rfl: 1   Allergies: No Known Allergies  REVIEW OF SYSTEMS:   Review of Systems  Constitutional:  Negative for chills, fatigue and fever.  HENT:   Negative for lump/mass, mouth sores, nosebleeds, sore throat and trouble swallowing.   Eyes:  Negative for eye problems.  Respiratory:  Negative for cough and shortness of breath.   Cardiovascular:  Negative for chest pain, leg swelling and palpitations.  Gastrointestinal:  Negative for abdominal pain, constipation, diarrhea, nausea and vomiting.  Genitourinary:  Positive for frequency (at night). Negative for bladder incontinence, difficulty urinating, dysuria, hematuria and nocturia.   Musculoskeletal:  Negative for arthralgias, back pain, flank pain, myalgias and neck pain.  Skin:  Negative for itching and rash.  Neurological:  Negative for dizziness, headaches and numbness.  Hematological:  Does not bruise/bleed easily.  Psychiatric/Behavioral:  Negative for depression, sleep disturbance and suicidal ideas. The patient is not nervous/anxious.   All other systems reviewed and are negative.    VITALS:   Blood pressure 138/76, pulse (!) 52, temperature 98.1 F (36.7 C), temperature source Oral, resp. rate 18, height 5\' 6"  (1.676 m), weight 157 lb 6.4 oz (71.4 kg), SpO2 99 %.  Wt Readings from Last 3 Encounters:  02/21/23 157 lb 6.4 oz (71.4 kg)  01/24/23 166 lb 8 oz (75.5 kg)  12/22/22 169 lb 11.2 oz (77 kg)    Body mass index is 25.41 kg/m.  Performance status (ECOG): 1 - Symptomatic but completely ambulatory  PHYSICAL EXAM:   Physical Exam Vitals and nursing note reviewed. Exam conducted with a chaperone present.  Constitutional:      Appearance: Normal appearance.  Cardiovascular:     Rate and Rhythm: Normal rate and regular rhythm.     Pulses: Normal pulses.     Heart sounds: Normal heart sounds.  Pulmonary:     Effort: Pulmonary effort is normal.     Breath sounds: Normal  breath sounds.  Abdominal:     Palpations: Abdomen is soft. There is no hepatomegaly, splenomegaly or mass.     Tenderness: There is no abdominal tenderness.  Musculoskeletal:     Right lower leg: No edema.     Left lower leg: No edema.  Lymphadenopathy:     Cervical: No cervical adenopathy.     Right cervical: No superficial, deep or posterior cervical adenopathy.    Left cervical: No superficial, deep or posterior cervical adenopathy.     Upper Body:     Right upper body: No supraclavicular or axillary adenopathy.     Left upper body: No supraclavicular or axillary adenopathy.  Neurological:     General: No focal deficit present.     Mental Status: She is alert and oriented to person, place, and time.  Psychiatric:        Mood and Affect: Mood  normal.        Behavior: Behavior normal.     LABS:      Latest Ref Rng & Units 02/14/2023    9:02 AM 01/24/2023   12:37 PM 12/16/2022    8:48 AM  CBC  WBC 4.0 - 10.5 K/uL 3.1  4.0  3.3   Hemoglobin 12.0 - 15.0 g/dL 40.9  81.1  91.4   Hematocrit 36.0 - 46.0 % 33.4  32.1  36.4   Platelets 150 - 400 K/uL 196  209  227       Latest Ref Rng & Units 02/14/2023    9:02 AM 01/24/2023   12:37 PM 01/12/2023    1:17 PM  CMP  Glucose 70 - 99 mg/dL 782  96  95   BUN 8 - 23 mg/dL 25  28  30    Creatinine 0.44 - 1.00 mg/dL 9.56  2.13  0.86   Sodium 135 - 145 mmol/L 136  135  136   Potassium 3.5 - 5.1 mmol/L 4.9  4.6  5.0   Chloride 98 - 111 mmol/L 105  104  104   CO2 22 - 32 mmol/L 24  24  19    Calcium 8.9 - 10.3 mg/dL 8.7  8.6  8.8    9.1   Total Protein 6.5 - 8.1 g/dL 7.0  7.5    Total Bilirubin 0.3 - 1.2 mg/dL 0.6  0.8    Alkaline Phos 38 - 126 U/L 56  64    AST 15 - 41 U/L 15  16    ALT 0 - 44 U/L 10  11       No results found for: "CEA1", "CEA" / No results found for: "CEA1", "CEA" No results found for: "PSA1" No results found for: "VHQ469" No results found for: "CAN125"  No results found for: "TOTALPROTELP", "ALBUMINELP", "A1GS",  "A2GS", "BETS", "BETA2SER", "GAMS", "MSPIKE", "SPEI" Lab Results  Component Value Date   TIBC 290 01/12/2023   FERRITIN 118 01/12/2023   IRONPCTSAT 34 (H) 01/12/2023   No results found for: "LDH"   STUDIES:   No results found.

## 2023-02-23 ENCOUNTER — Other Ambulatory Visit: Payer: Self-pay | Admitting: Hematology

## 2023-02-24 ENCOUNTER — Encounter: Payer: Self-pay | Admitting: Hematology

## 2023-03-10 ENCOUNTER — Other Ambulatory Visit (HOSPITAL_COMMUNITY): Payer: Self-pay

## 2023-03-21 ENCOUNTER — Other Ambulatory Visit (HOSPITAL_COMMUNITY): Payer: Self-pay

## 2023-03-24 ENCOUNTER — Inpatient Hospital Stay: Payer: 59

## 2023-03-24 ENCOUNTER — Inpatient Hospital Stay: Payer: 59 | Admitting: Licensed Clinical Social Worker

## 2023-04-14 ENCOUNTER — Other Ambulatory Visit: Payer: Self-pay | Admitting: Hematology

## 2023-04-14 ENCOUNTER — Other Ambulatory Visit: Payer: Self-pay | Admitting: *Deleted

## 2023-04-14 ENCOUNTER — Inpatient Hospital Stay: Payer: 59 | Attending: Hematology

## 2023-04-14 ENCOUNTER — Other Ambulatory Visit (HOSPITAL_COMMUNITY): Payer: Self-pay

## 2023-04-14 ENCOUNTER — Other Ambulatory Visit: Payer: Self-pay

## 2023-04-14 DIAGNOSIS — Z170 Estrogen receptor positive status [ER+]: Secondary | ICD-10-CM | POA: Insufficient documentation

## 2023-04-14 DIAGNOSIS — M8580 Other specified disorders of bone density and structure, unspecified site: Secondary | ICD-10-CM | POA: Insufficient documentation

## 2023-04-14 DIAGNOSIS — C50412 Malignant neoplasm of upper-outer quadrant of left female breast: Secondary | ICD-10-CM | POA: Insufficient documentation

## 2023-04-14 LAB — CBC WITH DIFFERENTIAL/PLATELET
Abs Immature Granulocytes: 0 10*3/uL (ref 0.00–0.07)
Basophils Absolute: 0 10*3/uL (ref 0.0–0.1)
Basophils Relative: 0 %
Eosinophils Absolute: 0 10*3/uL (ref 0.0–0.5)
Eosinophils Relative: 0 %
HCT: 30.3 % — ABNORMAL LOW (ref 36.0–46.0)
Hemoglobin: 10.1 g/dL — ABNORMAL LOW (ref 12.0–15.0)
Lymphocytes Relative: 37 %
Lymphs Abs: 1.3 10*3/uL (ref 0.7–4.0)
MCH: 35.7 pg — ABNORMAL HIGH (ref 26.0–34.0)
MCHC: 33.3 g/dL (ref 30.0–36.0)
MCV: 107.1 fL — ABNORMAL HIGH (ref 80.0–100.0)
Monocytes Absolute: 0.1 10*3/uL (ref 0.1–1.0)
Monocytes Relative: 4 %
Neutro Abs: 2 10*3/uL (ref 1.7–7.7)
Neutrophils Relative %: 59 %
Platelets: 159 10*3/uL (ref 150–400)
RBC: 2.83 MIL/uL — ABNORMAL LOW (ref 3.87–5.11)
RDW: 13.7 % (ref 11.5–15.5)
WBC: 3.4 10*3/uL — ABNORMAL LOW (ref 4.0–10.5)
nRBC: 0 % (ref 0.0–0.2)

## 2023-04-14 LAB — COMPREHENSIVE METABOLIC PANEL
ALT: 9 U/L (ref 0–44)
AST: 17 U/L (ref 15–41)
Albumin: 4.1 g/dL (ref 3.5–5.0)
Alkaline Phosphatase: 59 U/L (ref 38–126)
Anion gap: 7 (ref 5–15)
BUN: 36 mg/dL — ABNORMAL HIGH (ref 8–23)
CO2: 26 mmol/L (ref 22–32)
Calcium: 9.1 mg/dL (ref 8.9–10.3)
Chloride: 103 mmol/L (ref 98–111)
Creatinine, Ser: 1.59 mg/dL — ABNORMAL HIGH (ref 0.44–1.00)
GFR, Estimated: 32 mL/min — ABNORMAL LOW (ref >60)
Glucose, Bld: 98 mg/dL (ref 70–99)
Potassium: 4.4 mmol/L (ref 3.5–5.1)
Sodium: 136 mmol/L (ref 135–145)
Total Bilirubin: 0.7 mg/dL (ref 0.3–1.2)
Total Protein: 6.9 g/dL (ref 6.5–8.1)

## 2023-04-14 MED ORDER — PALBOCICLIB 100 MG PO TABS
100.0000 mg | ORAL_TABLET | Freq: Every day | ORAL | 1 refills | Status: DC
Start: 2023-04-14 — End: 2023-06-06
  Filled 2023-04-14: qty 21, 28d supply, fill #0
  Filled 2023-05-05: qty 21, 28d supply, fill #1

## 2023-04-14 NOTE — Telephone Encounter (Signed)
Ibrance refill approved.  Patient tolerating and is to continue therapy.

## 2023-04-15 ENCOUNTER — Other Ambulatory Visit (HOSPITAL_COMMUNITY): Payer: Self-pay

## 2023-04-15 LAB — CANCER ANTIGEN 15-3: CA 15-3: 30.7 U/mL — ABNORMAL HIGH (ref 0.0–25.0)

## 2023-04-15 LAB — CANCER ANTIGEN 27.29: CA 27.29: 58.6 U/mL — ABNORMAL HIGH (ref 0.0–38.6)

## 2023-04-21 ENCOUNTER — Encounter (HOSPITAL_COMMUNITY)
Admission: RE | Admit: 2023-04-21 | Discharge: 2023-04-21 | Disposition: A | Discharge status: Home / Self Care | Payer: 59 | Source: Ambulatory Visit | Attending: Hematology | Admitting: Hematology

## 2023-04-21 DIAGNOSIS — C50412 Malignant neoplasm of upper-outer quadrant of left female breast: Secondary | ICD-10-CM | POA: Insufficient documentation

## 2023-04-21 DIAGNOSIS — Z170 Estrogen receptor positive status [ER+]: Secondary | ICD-10-CM | POA: Insufficient documentation

## 2023-04-21 MED ORDER — FLUDEOXYGLUCOSE F - 18 (FDG) INJECTION
8.7700 | Freq: Once | INTRAVENOUS | Status: AC | PRN
  Administered 2023-04-21: 8.77 via INTRAVENOUS

## 2023-04-26 ENCOUNTER — Other Ambulatory Visit: Payer: Self-pay

## 2023-04-26 ENCOUNTER — Inpatient Hospital Stay: Payer: 59 | Attending: Hematology | Admitting: Hematology

## 2023-04-26 VITALS — BP 171/97 | HR 95 | Temp 99.5°F | Resp 18 | Ht 66.0 in | Wt 161.8 lb

## 2023-04-26 DIAGNOSIS — Z806 Family history of leukemia: Secondary | ICD-10-CM | POA: Insufficient documentation

## 2023-04-26 DIAGNOSIS — M25561 Pain in right knee: Secondary | ICD-10-CM | POA: Insufficient documentation

## 2023-04-26 DIAGNOSIS — C50412 Malignant neoplasm of upper-outer quadrant of left female breast: Secondary | ICD-10-CM

## 2023-04-26 DIAGNOSIS — Z170 Estrogen receptor positive status [ER+]: Secondary | ICD-10-CM | POA: Insufficient documentation

## 2023-04-26 DIAGNOSIS — N189 Chronic kidney disease, unspecified: Secondary | ICD-10-CM | POA: Insufficient documentation

## 2023-04-26 DIAGNOSIS — Z808 Family history of malignant neoplasm of other organs or systems: Secondary | ICD-10-CM | POA: Diagnosis not present

## 2023-04-26 DIAGNOSIS — M1611 Unilateral primary osteoarthritis, right hip: Secondary | ICD-10-CM | POA: Insufficient documentation

## 2023-04-26 DIAGNOSIS — C50812 Malignant neoplasm of overlapping sites of left female breast: Secondary | ICD-10-CM | POA: Insufficient documentation

## 2023-04-26 DIAGNOSIS — F17210 Nicotine dependence, cigarettes, uncomplicated: Secondary | ICD-10-CM | POA: Insufficient documentation

## 2023-04-26 DIAGNOSIS — Z803 Family history of malignant neoplasm of breast: Secondary | ICD-10-CM | POA: Diagnosis present

## 2023-04-26 DIAGNOSIS — M810 Age-related osteoporosis without current pathological fracture: Secondary | ICD-10-CM | POA: Diagnosis not present

## 2023-04-26 MED ORDER — TRAMADOL HCL 50 MG PO TABS: 50.0000 mg | ORAL_TABLET | Freq: Two times a day (BID) | ORAL | 0 refills | Status: DC | PRN

## 2023-04-26 NOTE — Patient Instructions (Signed)
[facility]  Discharge Instructions  You were seen and examined today by Dr. Ellin Saba.  Dr. Ellin Saba discussed your most recent lab work and PET scan which revealed good response to your current therapy.  Continue Ibrance and Anastrozole as prescribed.  Follow-up as scheduled.  Thank you for choosing [facility] to provide your oncology and hematology care.   To afford each patient quality time with our provider, please arrive at least 15 minutes before your scheduled appointment time. You may need to reschedule your appointment if you arrive late (10 or more minutes). Arriving late affects you and other patients whose appointments are after yours.  Also, if you miss three or more appointments without notifying the office, you may be dismissed from the clinic at the provider's discretion.    Again, thank you for choosing [facility].  Our hope is that these requests will decrease the amount of time that you wait before being seen by our physicians.   If you have a lab appointment with the Cancer Center - please note that after April 8th, all labs will be drawn in the cancer center.  You do not have to check in or register with the main entrance as you have in the past but will complete your check-in at the cancer center.            _____________________________________________________________  Should you have questions after your visit to [facility], please contact our office at [phone] and follow the prompts.  Our office hours are 8:00 a.m. to 4:30 p.m. Monday - Thursday and 8:00 a.m. to 2:30 p.m. Friday.  Please note that voicemails left after 4:00 p.m. may not be returned until the following business day.  We are closed weekends and all major holidays.  You do have access to a nurse 24-7, just call the main number to the clinic [phone] and do not press any options, hold on the line and a nurse  will answer the phone.    For prescription refill requests, have your pharmacy contact our office and allow 72 hours.    Masks are no longer required in the cancer centers. If you would like for your care team to wear a mask while they are taking care of you, please let them know. You may have one support person who is at least 83 years old accompany you for your appointments.

## 2023-04-26 NOTE — Progress Notes (Signed)
Patient is taking Ibrance and Anastrozole as prescribed.  She has not missed any doses and reports no side effects at this time.

## 2023-04-26 NOTE — Progress Notes (Signed)
[facility] [address] [place] [address]    Clinic Day:  04/26/2023  Referring physician: Marylynn Pearson, FNP  Patient Care Team: Marylynn Pearson, FNP as PCP - General (Family Medicine) Doreatha Massed, MD as Medical Oncologist (Medical Oncology)   ASSESSMENT & PLAN:   Assessment: 1.  Stage IV (T2 N0 G2, ER/PR+, HER2-) left breast UOQ IDC: - She noticed left breast mass in the first week of March. - Mammogram (10/21/2022): Irregular hypoechoic mass in the left breast at 12:30 position 4 CFN measuring 3.1 x 2.1 x 3 cm.  Mass extends to the level of skin.  There is a subtle shadowing mass in the left breast at the 12:30 position 7 CFN measuring 0.4 x 0.7 x 0.8 cm.  No axillary adenopathy. - Biopsy (11/02/2022): Grade 2 IDC, ER 70% moderate strong, PR 0%, HER2 1+, Ki-67 20%. - Ibrance 75 mg 3 weeks on/1 week off and anastrozole started on 12/03/2022.   2.  Left breast DCIS: - Biopsy (11/02/2022): DCIS, ER 90% strong staining, PR 0%   3.  Social/family history: - She is widowed 9 years ago and lives with her youngest son at home.  She does not drive but is independent of ADLs and some IADLs.  She worked on her tobacco form prior to retirement.  Current active smoker, 1 pack/day, started smoking at age 82. - Sister had breast cancer in her 66s.  Another sister had basal cell skin cancer.    Plan: 1.  Stage IV (T2 N0 G2, ER/PR+, HER2-) left breast UOQ IDC: - She is tolerating Ibrance and anastrozole reasonably well. - Reviewed labs from 04/14/2023: Normal LFTs.  White count is slightly low at 3.4 with normal ANC.  Platelet count is normal. - CA 15-3 is slightly better.  CA 27-29 is 58.6. - Reviewed PET scan from 04/21/2023: Improving left breast mass.  Equivocal mild hypermetabolism in the left perihilar region.  Previously seen thoracic lymphadenopathy has resolved. - We have discussed prognosis again with treatment intent in the palliative setting. -  Continue Ibrance 100 mg 3 weeks on/1 week off and anastrozole 1 mg tablet daily. - Recommend follow-up in 2 months with repeat tumor markers.   2.  Osteoporosis (DEXA 09/15/2022 T-score -3): - Prolia first dose on 12/30/2022. - Continue calcium and vitamin D supplements.  Calcium is 9.1.   3.  CKD: - Creatinine is stable at 1.59.  Continue follow-up with Dr. Wolfgang Phoenix.   4.  Right hip and right knee pain: - She has severe arthritis of the right hip.  Continue tramadol 50 mg in the mornings as needed.  Will send refill.    Orders Placed This Encounter  Procedures   CBC with Differential    Standing Status:   Future    Standing Expiration Date:   04/25/2024   Comprehensive metabolic panel    Standing Status:   Future    Standing Expiration Date:   04/25/2024   Magnesium    Standing Status:   Future    Standing Expiration Date:   04/25/2024   Cancer antigen 15-3    Standing Status:   Future    Standing Expiration Date:   04/25/2024   Cancer antigen 27.29    Standing Status:   Future    Standing Expiration Date:   04/25/2024      Alben Deeds Hardy,acting as a scribe for Doreatha Massed, MD.,have documented all relevant documentation on the behalf of Doreatha Massed, MD,as  directed by  Doreatha Massed, MD while in the presence of Doreatha Massed, MD.  I, Doreatha Massed MD, have reviewed the above documentation for accuracy and completeness, and I agree with the above.     Doreatha Massed, MD   9/10/20244:00 PM  CHIEF COMPLAINT:   Diagnosis: left breast carcinoma    Cancer Staging  Breast cancer of upper-outer quadrant of left female breast [facility]) Staging form: Breast, AJCC 8th Edition - Clinical stage from 11/10/2022: Stage IV (cT2, cN0, cM1, G2, ER+, PR-, HER2-) - Unsigned  Ductal carcinoma in situ (DCIS) of left breast Staging form: Breast, AJCC 8th Edition - Clinical stage from 11/10/2022: Stage 0 (cTis (DCIS), cN0, cM0, ER+, PR-, HER2: Not Assessed)  - Unsigned    Prior Therapy: none  Current Therapy:  Anastrozole and Ibrance    HISTORY OF PRESENT ILLNESS:   Oncology History   No history exists.     INTERVAL HISTORY:   Maria Hardy is a 82 y.o. female presenting to clinic today for follow up of left breast carcinoma. She was last seen by me on 02/21/23.  Since her last visit, she underwent restaging PET on 04/21/23 that found: improving left breast mass, corresponding to the patient's primary breast carcinoma; mild hypermetabolism in the left perihilar region, equivocal; otherwise, prior thoracic lymphadenopathy has resolved; suspected treated osseous metastasis at T10; and mild right oropharyngeal hypermetabolism, without CT correlate favored to be reactive.  She was seen in the ED at [facility] on 04/14/23 for vertigo.  Today, she states that she is doing well overall. Her appetite level is at 75%. Her energy level is at 70%. Patient is accompanied by family.   She notes a normal appetite. She reports her anastrozole makes her sleepy and tired. She denies any other side effects from treatment and is tolerating it well. She has consistent back and hip pain and would like a refill on her tramadol, as she is almost out.   She c/o occasional nausea and vomiting that she attributes to her vertigo.   PAST MEDICAL HISTORY:   Past Medical History: 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 breast, diagnosed 10/2022   Hypertension    Osteoporosis    Scoliosis    Vertigo     Surgical History: Past Surgical History:  Procedure Laterality Date   BREAST BIOPSY Left 11/02/2022   [place] LT BREAST BX W LOC DEV 1ST LESION IMG BX SPEC US GUIDE 11/02/2022 AP-ULTRASOUND   BREAST BIOPSY Left 11/02/2022   [place] LT BREAST BX W LOC DEV EA ADD LESION IMG BX SPEC US GUIDE 11/02/2022 AP-ULTRASOUND   thyroid tumopr remo N/A    THYROIDECTOMY      Social History: Social History   Socioeconomic  History   Marital status: Widowed    Spouse name: Not on file   Number of children: Not on file   Years of education: Not on file   Highest education level: Not on file  Occupational History   Occupation: owned tobacco farm  Tobacco Use   Smoking status: Every Day    Current packs/day: 1.00    Average packs/day: 1 pack/day for 58.0 years (58.0 ttl pk-yrs)    Types: Cigarettes   Smokeless tobacco: Never  Vaping Use   Vaping status: Never Used  Substance and Sexual Activity   Alcohol use: No   Drug use: No   Sexual activity: Not on file  Other Topics Concern   Not on file  Social History Narrative   Not on file   Social Determinants of Health   Financial Resource Strain: Not on file  Food Insecurity: No Food Insecurity (11/10/2022)   Hunger Vital Sign    Worried About Running Out of Food in the Last Year: Never true    Ran Out of Food in the Last Year: Never true  Transportation Needs: No Transportation Needs (11/10/2022)   PRAPARE - Administrator, Civil Service (Medical): No    Lack of Transportation (Non-Medical): No  Physical Activity: Not on file  Stress: Not on file  Social Connections: Not on file  Intimate Partner Violence: Not At Risk (11/10/2022)   Humiliation, Afraid, Rape, and Kick questionnaire    Fear of Current or Ex-Partner: No    Emotionally Abused: No    Physically Abused: No    Sexually Abused: No    Family History: Family History  Problem Relation Age of Onset   Breast cancer Sister 3 - 66   Basal cell carcinoma Sister    Leukemia Son 3    Current Medications:  Current Outpatient Medications:    acetaminophen (TYLENOL) 500 MG tablet, Take 500 mg by mouth every 6 (six) hours as needed (pain.)., Disp: , Rfl:    amLODipine (NORVASC) 10 MG tablet, Take 10 mg by mouth every evening., Disp: , Rfl:    anastrozole (ARIMIDEX) 1 MG tablet, Take 1 tablet (1 mg total) by mouth daily. (Patient taking differently: Take 1 mg by mouth every  evening.), Disp: 30 tablet, Rfl: 6   Ascorbic Acid (VITAMIN C PO), Take 1 tablet by mouth in the morning., Disp: , Rfl:    Cholecalciferol (VITAMIN D-3 PO), Take 1 tablet by mouth in the morning., Disp: , Rfl:    diclofenac (VOLTAREN) 75 MG EC tablet, Take 1 tablet (75 mg total) by mouth 2 (two) times daily with a meal., Disp: 60 tablet, Rfl: 2   dicyclomine (BENTYL) 10 MG capsule, Take 10 mg by mouth 3 (three) times daily., Disp: , Rfl:    gabapentin (NEURONTIN) 100 MG capsule, Take 100 mg by mouth 2 (two) times daily., Disp: , Rfl:    hydrochlorothiazide (HYDRODIURIL) 12.5 MG tablet, Take 12.5 mg by mouth every morning., Disp: , Rfl:    hydrOXYzine (ATARAX) 25 MG tablet, Take 25 mg by mouth 3 (three) times daily., Disp: , Rfl:    losartan (COZAAR) 100 MG tablet, Take 100 mg by mouth in the morning., Disp: , Rfl:    Magnesium Oxide (MAG-OX PO), Take 1 tablet by mouth in the morning., Disp: , Rfl:    meclizine (ANTIVERT) 25 MG tablet, Take 25 mg by mouth 3 (three) times daily as needed for dizziness., Disp: , Rfl:    metoprolol tartrate (LOPRESSOR) 25 MG tablet, Take 25 mg by mouth 2 (two) times daily., Disp: , Rfl:    Multiple Vitamin (MULTIVITAMIN WITH MINERALS) TABS tablet, Take 1 tablet by mouth in the morning., Disp: , Rfl:    naproxen sodium (ALEVE) 220 MG tablet, Take 220 mg by mouth daily as needed (pain.)., Disp: , Rfl:    Omega-3 Fatty Acids (FISH OIL PO), Take 1 capsule by mouth in the morning., Disp: , Rfl:    ondansetron (ZOFRAN ODT) 4 MG disintegrating tablet, Take 1 tablet (4 mg total) by mouth every 8 (eight) hours as needed., Disp: 10 tablet, Rfl: 1   palbociclib (IBRANCE) 100 MG tablet, Take 1 tablet (  100 mg total) by mouth daily. Take for 21 days on, 7 days off, repeat every 28 days., Disp: 30 tablet, Rfl: 1   traMADol (ULTRAM) 50 MG tablet, TAKE 1 TABLET BY MOUTH EVERY 12 HOURS AS NEEDED, Disp: 60 tablet, Rfl: 0   Allergies: No Known Allergies  REVIEW OF SYSTEMS:   Review  of Systems  Constitutional:  Negative for chills, fatigue and fever.  HENT:   Negative for lump/mass, mouth sores, nosebleeds, sore throat and trouble swallowing.   Eyes:  Negative for eye problems.  Respiratory:  Negative for cough and shortness of breath.   Cardiovascular:  Negative for chest pain, leg swelling and palpitations.  Gastrointestinal:  Positive for nausea and vomiting. Negative for abdominal pain, constipation and diarrhea.  Genitourinary:  Positive for nocturia. Negative for bladder incontinence, difficulty urinating, dysuria, frequency and hematuria.   Musculoskeletal:  Positive for arthralgias (in hips, 7/10 severity) and back pain (7/10 severity). Negative for flank pain, myalgias and neck pain.  Skin:  Negative for itching and rash.  Neurological:  Positive for dizziness (vertigo). Negative for headaches and numbness.  Hematological:  Does not bruise/bleed easily.  Psychiatric/Behavioral:  Positive for sleep disturbance. Negative for depression and suicidal ideas. The patient is not nervous/anxious.   All other systems reviewed and are negative.    VITALS:   Blood pressure (!) 171/97, pulse 95, temperature 99.5 F (37.5 C), temperature source Oral, resp. rate 18, height 5\' 6"  (1.676 m), weight 161 lb 12.8 oz (73.4 kg), SpO2 96%.  Wt Readings from Last 3 Encounters:  04/26/23 161 lb 12.8 oz (73.4 kg)  02/21/23 157 lb 6.4 oz (71.4 kg)  01/24/23 166 lb 8 oz (75.5 kg)    Body mass index is 26.12 kg/m.  Performance status (ECOG): 1 - Symptomatic but completely ambulatory  PHYSICAL EXAM:   Physical Exam Vitals and nursing note reviewed. Exam conducted with a chaperone present.  Constitutional:      Appearance: Normal appearance.  Cardiovascular:     Rate and Rhythm: Normal rate and regular rhythm.     Pulses: Normal pulses.     Heart sounds: Normal heart sounds.  Pulmonary:     Effort: Pulmonary effort is normal.     Breath sounds: Normal breath sounds.   Abdominal:     Palpations: Abdomen is soft. There is no hepatomegaly, splenomegaly or mass.     Tenderness: There is no abdominal tenderness.  Musculoskeletal:     Right lower leg: No edema.     Left lower leg: No edema.  Lymphadenopathy:     Cervical: No cervical adenopathy.     Right cervical: No superficial, deep or posterior cervical adenopathy.    Left cervical: No superficial, deep or posterior cervical adenopathy.     Upper Body:     Right upper body: No supraclavicular or axillary adenopathy.     Left upper body: No supraclavicular or axillary adenopathy.  Neurological:     General: No focal deficit present.     Mental Status: She is alert and oriented to person, place, and time.  Psychiatric:        Mood and Affect: Mood normal.        Behavior: Behavior normal.     LABS:      Latest Ref Rng & Units 04/14/2023    1:52 PM 02/14/2023    9:02 AM 01/24/2023   12:37 PM  CBC  WBC 4.0 - 10.5 K/uL 3.4  3.1  4.0  Hemoglobin 12.0 - 15.0 g/dL 16.1  09.6  04.5   Hematocrit 36.0 - 46.0 % 30.3  33.4  32.1   Platelets 150 - 400 K/uL 159  196  209       Latest Ref Rng & Units 04/14/2023    1:52 PM 02/14/2023    9:02 AM 01/24/2023   12:37 PM  CMP  Glucose 70 - 99 mg/dL 98  409  96   BUN 8 - 23 mg/dL 36  25  28   Creatinine 0.44 - 1.00 mg/dL 8.11  9.14  7.82   Sodium 135 - 145 mmol/L 136  136  135   Potassium 3.5 - 5.1 mmol/L 4.4  4.9  4.6   Chloride 98 - 111 mmol/L 103  105  104   CO2 22 - 32 mmol/L 26  24  24    Calcium 8.9 - 10.3 mg/dL 9.1  8.7  8.6   Total Protein 6.5 - 8.1 g/dL 6.9  7.0  7.5   Total Bilirubin 0.3 - 1.2 mg/dL 0.7  0.6  0.8   Alkaline Phos 38 - 126 U/L 59  56  64   AST 15 - 41 U/L 17  15  16    ALT 0 - 44 U/L 9  10  11       No results found for: "CEA1", "CEA" / No results found for: "CEA1", "CEA" No results found for: "PSA1" No results found for: "NFA213" No results found for: "CAN125"  No results found for: "TOTALPROTELP", "ALBUMINELP", "A1GS",  "A2GS", "BETS", "BETA2SER", "GAMS", "MSPIKE", "SPEI" Lab Results  Component Value Date   TIBC 290 01/12/2023   FERRITIN 118 01/12/2023   IRONPCTSAT 34 (H) 01/12/2023   No results found for: "LDH"   STUDIES:   NM PET Image Restag (PS) Skull Base To Thigh  Result Date: 04/26/2023 CLINICAL DATA:  Subsequent treatment strategy for breast cancer. EXAM: NUCLEAR MEDICINE PET SKULL BASE TO THIGH TECHNIQUE: 8.8 mCi F-18 FDG was injected intravenously. Full-ring PET imaging was performed from the skull base to thigh after the radiotracer. CT data was obtained and used for attenuation correction and anatomic localization. Fasting blood glucose: 93 mg/dl COMPARISON:  08/65/7846 FINDINGS: Mediastinal blood pool activity: SUV max 2.7 Liver activity: SUV max NA NECK: Mild right oropharyngeal hypermetabolism, max SUV 5.7, without CT correlate. This is favored to be reactive but warrants attention on follow-up. No hypermetabolic cervical lymphadenopathy. Status post right thyroidectomy with left thyroid goiter. Stable focal hypermetabolism along the left posterior thyroid, max SUV 7.1, previously 8.6. Given age/comorbidities, no follow-up is recommended. Incidental CT findings: None. CHEST: 2.2 cm central left breast mass (series 202/image 63), max SUV 3.5, corresponding to the patient's primary breast carcinoma. This previously measured 2.5 cm with max SUV 5.0. Mild hypermetabolism in the left perihilar region, max SUV 4.2, equivocal. Otherwise, prior thoracic lymphadenopathy has resolved. Stable tiny upper lobe pulmonary nodules, likely benign. Incidental CT findings: Atherosclerotic calcifications of the aortic arch. Moderate three-vessel coronary atherosclerosis. ABDOMEN/PELVIS: No abnormal hypermetabolism in the liver, spleen, pancreas, or adrenal glands. No hypermetabolic abdominopelvic lymphadenopathy. Incidental CT findings: Tiny layering gallstones. Left renal cysts measuring up to 2.4 cm (series 2/image 92),  benign (Bosniak I). No follow-up recommended. Left colonic diverticulosis, without evidence of diverticulitis. Atherosclerotic calcifications of the abdominal aorta and branch vessels. SKELETON: No focal hypermetabolic activity to suggest skeletal metastasis. However, there is a 10 mm sclerotic lesion at the T10 vertebral body (series 202/image 71) which is new from the prior,  non FDG avid. This may reflect a treated metastasis. Incidental CT findings: Degenerative changes of the visualized thoracolumbar spine. Degenerative changes of the bilateral hips, right greater than left, with associated hypermetabolism on the right. IMPRESSION: Improving left breast mass, corresponding to the patient's primary breast carcinoma. Mild hypermetabolism in the left perihilar region, equivocal. Otherwise, prior thoracic lymphadenopathy has resolved. Suspected treated osseous metastasis at T10. Mild right oropharyngeal hypermetabolism, without CT correlate, favored to be reactive. This warrants attention on follow-up. Electronically Signed   By: Charline Bills M.D.   On: 04/26/2023 08:30

## 2023-04-28 ENCOUNTER — Inpatient Hospital Stay: Payer: 59 | Admitting: Licensed Clinical Social Worker

## 2023-04-28 ENCOUNTER — Other Ambulatory Visit: Payer: Self-pay | Admitting: *Deleted

## 2023-05-05 ENCOUNTER — Other Ambulatory Visit (HOSPITAL_COMMUNITY): Payer: Self-pay

## 2023-05-30 ENCOUNTER — Other Ambulatory Visit: Payer: Self-pay | Admitting: Hematology

## 2023-06-06 ENCOUNTER — Other Ambulatory Visit (HOSPITAL_COMMUNITY): Payer: Self-pay

## 2023-06-06 ENCOUNTER — Other Ambulatory Visit: Payer: Self-pay

## 2023-06-06 ENCOUNTER — Other Ambulatory Visit: Payer: Self-pay | Admitting: Hematology

## 2023-06-06 DIAGNOSIS — Z170 Estrogen receptor positive status [ER+]: Secondary | ICD-10-CM

## 2023-06-06 MED ORDER — PALBOCICLIB 100 MG PO TABS
100.0000 mg | ORAL_TABLET | Freq: Every day | ORAL | 3 refills | Status: DC
  Filled 2023-06-06: qty 21, 28d supply, fill #0
  Filled 2023-06-29: qty 21, 28d supply, fill #1
  Filled 2023-08-01: qty 21, 28d supply, fill #2
  Filled 2023-08-29: qty 21, 28d supply, fill #3

## 2023-06-06 NOTE — Progress Notes (Signed)
Specialty Pharmacy Refill Coordination Note  Maria Hardy is a 82 y.o. female contacted today regarding refills of specialty medication(s) Palbociclib   Patient requested Delivery   Delivery date: 06/14/23   Verified address: [address]  [place] [address]   Medication will be filled on 06/14/23. *Pending refill request*

## 2023-06-06 NOTE — Progress Notes (Signed)
Specialty Pharmacy Ongoing Clinical Assessment Note  Maria Hardy is a 82 y.o. female who is being followed by the specialty pharmacy service for RxSp Oncology   Patient's specialty medication(s) reviewed today: Palbociclib   Missed doses in the last 4 weeks: 0   Patient/Caregiver did not have any additional questions or concerns.   Therapeutic benefit summary: Patient is achieving benefit   Adverse events/side effects summary: No adverse events/side effects   Patient's therapy is appropriate to: Continue    Goals Addressed             This Visit's Progress    Slow Disease Progression       Patient is on track. Patient will maintain adherence.  Most recent PET scan showed improvement.         Follow up:  3 months  Servando Snare Specialty Pharmacist

## 2023-06-07 ENCOUNTER — Other Ambulatory Visit (HOSPITAL_COMMUNITY): Payer: Self-pay

## 2023-06-13 ENCOUNTER — Telehealth: Payer: Self-pay

## 2023-06-13 ENCOUNTER — Ambulatory Visit (INDEPENDENT_AMBULATORY_CARE_PROVIDER_SITE_OTHER): Payer: 59 | Admitting: Orthopedic Surgery

## 2023-06-13 ENCOUNTER — Other Ambulatory Visit (INDEPENDENT_AMBULATORY_CARE_PROVIDER_SITE_OTHER): Payer: 59

## 2023-06-13 ENCOUNTER — Telehealth: Payer: Self-pay | Admitting: Radiology

## 2023-06-13 ENCOUNTER — Encounter: Payer: Self-pay | Admitting: Orthopedic Surgery

## 2023-06-13 DIAGNOSIS — M25551 Pain in right hip: Secondary | ICD-10-CM

## 2023-06-13 DIAGNOSIS — M1611 Unilateral primary osteoarthritis, right hip: Secondary | ICD-10-CM | POA: Diagnosis not present

## 2023-06-13 MED ORDER — ACETAMINOPHEN-CODEINE 300-30 MG PO TABS
1.0000 | ORAL_TABLET | Freq: Four times a day (QID) | ORAL | 0 refills | Status: DC | PRN
Start: 2023-06-13 — End: 2024-02-24

## 2023-06-13 NOTE — Telephone Encounter (Signed)
Yes I stopped the tram

## 2023-06-13 NOTE — Progress Notes (Signed)
Patient: Maria Hardy           Date of Birth: 1941-06-26           MRN: [id number] Visit Date: 06/13/2023 Requested by: Marylynn Pearson, FNP [address] [place],  [place] [address] PCP: Marylynn Pearson, FNP   Chief Complaint  Patient presents with   Follow-up    Recheck on back pain.   Encounter Diagnoses  Name Primary?   Pain in right hip    Unilateral primary osteoarthritis, right hip Yes    Plan:  82 year old female who is currently undergoing breast cancer treatment at the cancer center she is on a pill that she says she will have to take for 3 years.  She has a good heart according to her she does have some hypertension she has end-stage osteoarthritis of the right hip moderate arthritis of the left hip and needs a right total hip arthroplasty  I placed a note to the cancer specialist to see when and if she could have surgery on this medication  I ordered an intra-articular injection into the right hip and started on Tylenol 3    Chief Complaint  Patient presents with   Follow-up    Recheck on back pain.    82 year old female with hypertension currently undergoing breast cancer treatment presents with which is worsening to include groin pain and some lower back pain on the right associated with worsening pain and trouble walking    Problem list, medical hx, medications and allergies reviewed   No Known Allergies  There were no vitals taken for this visit.   Physical exam: Physical Exam Vitals and nursing note reviewed.  Constitutional:      Appearance: Normal appearance.  HENT:     Head: Normocephalic and atraumatic.  Eyes:     General: No scleral icterus.       Right eye: No discharge.        Left eye: No discharge.     Extraocular Movements: Extraocular movements intact.     Conjunctiva/sclera: Conjunctivae normal.     Pupils: Pupils are equal, round, and reactive to light.  Cardiovascular:     Rate and Rhythm: Normal rate.      Pulses: Normal pulses.  Musculoskeletal:     Comments: Right hip held in flexion  Cannot get is extended and only flexes to 60 degrees  Tenderness right lower back  Painful internal rotation external rotation right hip  Skin:    General: Skin is warm and dry.     Capillary Refill: Capillary refill takes less than 2 seconds.  Neurological:     General: No focal deficit present.     Mental Status: She is alert and oriented to person, place, and time.     Gait: Gait abnormal.     Comments: Cane right hand  Psychiatric:        Mood and Affect: Mood normal.        Behavior: Behavior normal.        Thought Content: Thought content normal.        Judgment: Judgment normal.     Ortho Exam  See above  Data reviewed:   Image(s) reviewed with personal interpretation:  AP pelvis AP and lateral right hip  Severe arthritis of the right hip moderate arthritis of the left hip right hip is at end-stage disease femoral anatomy is type a  Assessment and plan:  Encounter Diagnoses  Name Primary?   Pain in  right hip    Unilateral primary osteoarthritis, right hip Yes    82 year old female end-stage arthritis right hip moderate arthritis left hip currently undergoing cancer treatment really needs a right total hip arthroplasty however, the question is whether or not she can have this while undergoing the cancer treatment  Discussed with Dr. Kirtland Bouchard via staff message to see what his thoughts are regarding that  In the meantime intra-articular injection right hip  Start Tylenol 3 for pain note: Patient tried Tylenol Advil and tramadol without relief   Meds ordered this encounter  Medications   acetaminophen-codeine (TYLENOL #3) 300-30 MG tablet    Sig: Take 1 tablet by mouth every 6 (six) hours as needed for moderate pain (pain score 4-6). Medication must last 30 days    Dispense:  30 tablet    Refill:  0    Procedures:   none

## 2023-06-13 NOTE — Telephone Encounter (Signed)
I called to advise d/c Tramadol take Tylenol #3

## 2023-06-13 NOTE — Telephone Encounter (Signed)
Synetta Fail at [facility] left message stating to let Dr. Romeo Apple know that patient  gets Tramadol from her oncologist and they are ok with filling the Tylenol 3 if he still wants them to.

## 2023-06-13 NOTE — Patient Instructions (Signed)
Maria Hardy will call you with appointment at [facility] for hip injection

## 2023-06-13 NOTE — Telephone Encounter (Signed)
Call patient with appointment at [facility] Right hip injection

## 2023-06-14 ENCOUNTER — Telehealth: Payer: Self-pay | Admitting: Orthopedic Surgery

## 2023-06-14 NOTE — Telephone Encounter (Signed)
Dr. Mort Sawyers pt - spoke w/the pt, she stated she was seen yesterday and that the pharmacy said that they need info from [place] before she can get her medication.  She would like a call back [phone]

## 2023-06-14 NOTE — Telephone Encounter (Signed)
I called pharmacy and told them the information yesterday I called her to let her know

## 2023-06-15 NOTE — Telephone Encounter (Signed)
Tuesday Nov 5th  845 arrival for 9am appointment  I called her she voiced understanding

## 2023-06-21 ENCOUNTER — Ambulatory Visit (HOSPITAL_COMMUNITY)
Admission: RE | Admit: 2023-06-21 | Discharge: 2023-06-21 | Disposition: A | Discharge status: Home / Self Care | Payer: 59 | Source: Ambulatory Visit | Attending: Orthopedic Surgery | Admitting: Orthopedic Surgery

## 2023-06-21 DIAGNOSIS — M1611 Unilateral primary osteoarthritis, right hip: Secondary | ICD-10-CM

## 2023-06-21 DIAGNOSIS — M25551 Pain in right hip: Secondary | ICD-10-CM

## 2023-06-22 ENCOUNTER — Inpatient Hospital Stay: Payer: 59

## 2023-06-22 ENCOUNTER — Other Ambulatory Visit: Payer: 59

## 2023-06-23 ENCOUNTER — Other Ambulatory Visit: Payer: Self-pay | Admitting: Hematology

## 2023-06-24 ENCOUNTER — Inpatient Hospital Stay: Payer: 59

## 2023-06-24 ENCOUNTER — Inpatient Hospital Stay: Payer: 59 | Attending: Hematology

## 2023-06-24 VITALS — BP 154/66 | HR 90 | Temp 97.4°F | Resp 18

## 2023-06-24 DIAGNOSIS — C50412 Malignant neoplasm of upper-outer quadrant of left female breast: Secondary | ICD-10-CM | POA: Insufficient documentation

## 2023-06-24 DIAGNOSIS — Z79811 Long term (current) use of aromatase inhibitors: Secondary | ICD-10-CM | POA: Diagnosis not present

## 2023-06-24 DIAGNOSIS — Z170 Estrogen receptor positive status [ER+]: Secondary | ICD-10-CM

## 2023-06-24 DIAGNOSIS — M810 Age-related osteoporosis without current pathological fracture: Secondary | ICD-10-CM | POA: Diagnosis present

## 2023-06-24 DIAGNOSIS — F17210 Nicotine dependence, cigarettes, uncomplicated: Secondary | ICD-10-CM | POA: Diagnosis not present

## 2023-06-24 LAB — COMPREHENSIVE METABOLIC PANEL
ALT: 10 U/L (ref 0–44)
AST: 15 U/L (ref 15–41)
Albumin: 3.7 g/dL (ref 3.5–5.0)
Alkaline Phosphatase: 55 U/L (ref 38–126)
Anion gap: 6 (ref 5–15)
BUN: 32 mg/dL — ABNORMAL HIGH (ref 8–23)
CO2: 25 mmol/L (ref 22–32)
Calcium: 8.9 mg/dL (ref 8.9–10.3)
Chloride: 106 mmol/L (ref 98–111)
Creatinine, Ser: 1.77 mg/dL — ABNORMAL HIGH (ref 0.44–1.00)
GFR, Estimated: 28 mL/min — ABNORMAL LOW (ref >60)
Glucose, Bld: 98 mg/dL (ref 70–99)
Potassium: 4.8 mmol/L (ref 3.5–5.1)
Sodium: 137 mmol/L (ref 135–145)
Total Bilirubin: 0.4 mg/dL (ref <1.2)
Total Protein: 6.4 g/dL — ABNORMAL LOW (ref 6.5–8.1)

## 2023-06-24 LAB — CBC WITH DIFFERENTIAL/PLATELET
Abs Immature Granulocytes: 0.01 10*3/uL (ref 0.00–0.07)
Basophils Absolute: 0 10*3/uL (ref 0.0–0.1)
Basophils Relative: 1 %
Eosinophils Absolute: 0 10*3/uL (ref 0.0–0.5)
Eosinophils Relative: 1 %
HCT: 30.7 % — ABNORMAL LOW (ref 36.0–46.0)
Hemoglobin: 9.9 g/dL — ABNORMAL LOW (ref 12.0–15.0)
Immature Granulocytes: 0 %
Lymphocytes Relative: 30 %
Lymphs Abs: 0.9 10*3/uL (ref 0.7–4.0)
MCH: 35.5 pg — ABNORMAL HIGH (ref 26.0–34.0)
MCHC: 32.2 g/dL (ref 30.0–36.0)
MCV: 110 fL — ABNORMAL HIGH (ref 80.0–100.0)
Monocytes Absolute: 0.2 10*3/uL (ref 0.1–1.0)
Monocytes Relative: 8 %
Neutro Abs: 1.9 10*3/uL (ref 1.7–7.7)
Neutrophils Relative %: 60 %
Platelets: 219 10*3/uL (ref 150–400)
RBC: 2.79 MIL/uL — ABNORMAL LOW (ref 3.87–5.11)
RDW: 13 % (ref 11.5–15.5)
WBC: 3.1 10*3/uL — ABNORMAL LOW (ref 4.0–10.5)
nRBC: 0 % (ref 0.0–0.2)

## 2023-06-24 LAB — MAGNESIUM: Magnesium: 2.2 mg/dL (ref 1.7–2.4)

## 2023-06-24 MED ORDER — DENOSUMAB 60 MG/ML ~~LOC~~ SOSY
60.0000 mg | PREFILLED_SYRINGE | Freq: Once | SUBCUTANEOUS | Status: AC
  Administered 2023-06-24: 60 mg via SUBCUTANEOUS
  Filled 2023-06-24: qty 1

## 2023-06-24 NOTE — Progress Notes (Signed)
Patient tolerated injection with no complaints voiced.  Site clean and dry with no bruising or swelling noted at site.  See MAR for details.  Band aid applied.  Patient stable during and after injection.  Vss with discharge and left in satisfactory condition with no s/s of distress noted. Pt discharge via wheelchair to be escorted home by family member. All follow ups as scheduled.  Maria Hardy [employer]

## 2023-06-24 NOTE — Patient Instructions (Signed)
[facility] - A DEPT OF [facility]  Discharge Instructions: Thank you for choosing [facility] to provide your oncology and hematology care.  If you have a lab appointment with the Cancer Center - please note that after April 8th, 2024, all labs will be drawn in the cancer center.  You do not have to check in or register with the main entrance as you have in the past but will complete your check-in in the cancer center.  Wear comfortable clothing and clothing appropriate for easy access to any Portacath or PICC line.   We strive to give you quality time with your provider. You may need to reschedule your appointment if you arrive late (15 or more minutes).  Arriving late affects you and other patients whose appointments are after yours.  Also, if you miss three or more appointments without notifying the office, you may be dismissed from the clinic at the provider's discretion.      For prescription refill requests, have your pharmacy contact our office and allow 72 hours for refills to be completed.    Today you received the following prolia   To help prevent nausea and vomiting after your treatment, we encourage you to take your nausea medication as directed.  BELOW ARE SYMPTOMS THAT SHOULD BE REPORTED IMMEDIATELY: *FEVER GREATER THAN 100.4 F (38 C) OR HIGHER *CHILLS OR SWEATING *NAUSEA AND VOMITING THAT IS NOT CONTROLLED WITH YOUR NAUSEA MEDICATION *UNUSUAL SHORTNESS OF BREATH *UNUSUAL BRUISING OR BLEEDING *URINARY PROBLEMS (pain or burning when urinating, or frequent urination) *BOWEL PROBLEMS (unusual diarrhea, constipation, pain near the anus) TENDERNESS IN MOUTH AND THROAT WITH OR WITHOUT PRESENCE OF ULCERS (sore throat, sores in mouth, or a toothache) UNUSUAL RASH, SWELLING OR PAIN  UNUSUAL VAGINAL DISCHARGE OR ITCHING   Items with * indicate a potential emergency and should be followed up as soon as possible or go to the Emergency  Department if any problems should occur.  Please show the CHEMOTHERAPY ALERT CARD or IMMUNOTHERAPY ALERT CARD at check-in to the Emergency Department and triage nurse.  Should you have questions after your visit or need to cancel or reschedule your appointment, please contact [facility] - A DEPT OF Eligha Bridegroom [facility] [phone]  and follow the prompts.  Office hours are 8:00 a.m. to 4:30 p.m. Monday - Friday. Please note that voicemails left after 4:00 p.m. may not be returned until the following business day.  We are closed weekends and major holidays. You have access to a nurse at all times for urgent questions. Please call the main number to the clinic [phone] and follow the prompts.  For any non-urgent questions, you may also contact your provider using MyChart. We now offer e-Visits for anyone 23 and older to request care online for non-urgent symptoms. For details visit [email or web address].   Also download the MyChart app! Go to the app store, search "MyChart", open the app, select [facility], and log in with your MyChart username and password.

## 2023-06-25 LAB — CANCER ANTIGEN 15-3: CA 15-3: 29.4 U/mL — ABNORMAL HIGH (ref 0.0–25.0)

## 2023-06-25 LAB — CANCER ANTIGEN 27.29: CA 27.29: 40.9 U/mL — ABNORMAL HIGH (ref 0.0–38.6)

## 2023-06-27 ENCOUNTER — Encounter (HOSPITAL_COMMUNITY): Payer: Self-pay

## 2023-06-27 ENCOUNTER — Ambulatory Visit (HOSPITAL_COMMUNITY)
Admission: RE | Admit: 2023-06-27 | Discharge: 2023-06-27 | Disposition: A | Discharge status: Home / Self Care | Payer: 59 | Source: Ambulatory Visit | Attending: Orthopedic Surgery | Admitting: Orthopedic Surgery

## 2023-06-27 DIAGNOSIS — M1611 Unilateral primary osteoarthritis, right hip: Secondary | ICD-10-CM | POA: Diagnosis not present

## 2023-06-27 DIAGNOSIS — M25551 Pain in right hip: Secondary | ICD-10-CM | POA: Insufficient documentation

## 2023-06-27 MED ORDER — METHYLPREDNISOLONE ACETATE 40 MG/ML IJ SUSP
INTRAMUSCULAR | Status: AC
  Filled 2023-06-27: qty 1

## 2023-06-27 MED ORDER — IOHEXOL 180 MG/ML  SOLN
10.0000 mL | Freq: Once | INTRAMUSCULAR | Status: AC | PRN
  Administered 2023-06-27: 10 mL via INTRA_ARTICULAR

## 2023-06-27 MED ORDER — LIDOCAINE HCL (PF) 1 % IJ SOLN
INTRAMUSCULAR | Status: AC
  Filled 2023-06-27: qty 5

## 2023-06-27 MED ORDER — LIDOCAINE HCL (PF) 1 % IJ SOLN
5.0000 mL | Freq: Once | INTRAMUSCULAR | Status: AC
  Administered 2023-06-27: 5 mL via INTRADERMAL

## 2023-06-27 MED ORDER — METHYLPREDNISOLONE ACETATE 40 MG/ML INJ SUSP (RADIOLOG
40.0000 mg | Freq: Once | INTRAMUSCULAR | Status: AC
  Administered 2023-06-27: 40 mg via INTRA_ARTICULAR

## 2023-06-27 MED ORDER — BUPIVACAINE HCL (PF) 0.5 % IJ SOLN
3.0000 mL | Freq: Once | INTRAMUSCULAR | Status: AC
  Administered 2023-06-27: 3 mL via INTRA_ARTICULAR

## 2023-06-27 MED ORDER — BUPIVACAINE HCL (PF) 0.5 % IJ SOLN
INTRAMUSCULAR | Status: AC
  Filled 2023-06-27: qty 30

## 2023-06-27 NOTE — Procedures (Signed)
Pre procedural Dx: Right hip pain Post procedural Dx: Same  Technically successful fluoro guided steroid injection of the right hip.  Pt reported subjective improvement in both her right hip and knee pain following the injection.   EBL: None Complications: None immediate  Katherina Right, MD Pager #: [phone]

## 2023-06-29 ENCOUNTER — Other Ambulatory Visit: Payer: Self-pay

## 2023-06-29 ENCOUNTER — Ambulatory Visit: Payer: 59 | Admitting: Hematology

## 2023-06-29 NOTE — Progress Notes (Signed)
Specialty Pharmacy Refill Coordination Note  Maria Hardy is a 82 y.o. female contacted today regarding refills of specialty medication(s) Palbociclib   Patient requested Delivery   Delivery date: 07/12/23   Verified address: [address]  [place] [address]   Medication will be filled on 07/11/23.

## 2023-06-30 ENCOUNTER — Inpatient Hospital Stay: Payer: 59 | Admitting: Hematology

## 2023-06-30 VITALS — BP 178/81 | HR 61 | Temp 98.1°F | Resp 17 | Ht 66.0 in | Wt 163.3 lb

## 2023-06-30 DIAGNOSIS — C50412 Malignant neoplasm of upper-outer quadrant of left female breast: Secondary | ICD-10-CM

## 2023-06-30 DIAGNOSIS — Z170 Estrogen receptor positive status [ER+]: Secondary | ICD-10-CM | POA: Diagnosis not present

## 2023-06-30 DIAGNOSIS — M810 Age-related osteoporosis without current pathological fracture: Secondary | ICD-10-CM | POA: Diagnosis not present

## 2023-06-30 NOTE — Progress Notes (Signed)
[facility] [address] [place] [address]    Clinic Day:  06/30/2023  Referring physician: Marylynn Pearson, FNP  Patient Care Team: Marylynn Pearson, FNP as PCP - General (Family Medicine) Doreatha Massed, MD as Medical Oncologist (Medical Oncology)   ASSESSMENT & PLAN:   Assessment: 1.  Stage IV (T2 N0 G2, ER/PR+, HER2-) left breast UOQ IDC: - She noticed left breast mass in the first week of March. - Mammogram (10/21/2022): Irregular hypoechoic mass in the left breast at 12:30 position 4 CFN measuring 3.1 x 2.1 x 3 cm.  Mass extends to the level of skin.  There is a subtle shadowing mass in the left breast at the 12:30 position 7 CFN measuring 0.4 x 0.7 x 0.8 cm.  No axillary adenopathy. - Biopsy (11/02/2022): Grade 2 IDC, ER 70% moderate strong, PR 0%, HER2 1+, Ki-67 20%. - Ibrance 75 mg 3 weeks on/1 week off and anastrozole started on 12/03/2022.   2.  Left breast DCIS: - Biopsy (11/02/2022): DCIS, ER 90% strong staining, PR 0%   3.  Social/family history: - She is widowed 9 years ago and lives with her youngest son at home.  She does not drive but is independent of ADLs and some IADLs.  She worked on her tobacco form prior to retirement.  Current active smoker, 1 pack/day, started smoking at age 89. - Sister had breast cancer in her 65s.  Another sister had basal cell skin cancer.    Plan: 1.  Stage IV (T2 N0 G2, ER/PR+, HER2-) left breast UOQ IDC: - PET scan (04/21/2023): Improving left breast mass with the focal mild hypermetabolic on the left perihilar region.  Previously seen thoracic lymphadenopathy resolved. - She is tolerating Ibrance and anastrozole well. - Denies any GI side effects or infections. - Reviewed labs from 06/24/2023: Normal LFTs.  CBC with leukopenia and mild macrocytic anemia.  CA 15-3 improved to 29 and CA 27-29 improved 40 from 58. - Continue Ibrance 100 mg 3 weeks on/1 week off.  Continue anastrozole daily. - RTC 3 months for  follow-up with repeat tumor markers and labs.   2.  Osteoporosis (DEXA 09/15/2022 T-score -3): - First dose of Prolia on 12/30/2022.  She received second dose on 06/24/2023.  Tolerating well. - Continue calcium and vitamin D supplements.   3.  CKD: - Creatinine is stable at 1.77.  Continue to follow-up with Dr. Allena Katz.   4.  Right hip and right knee pain: - She has severe arthritis of the right hip.  Continue to follow-up with Dr. Romeo Apple.    Orders Placed This Encounter  Procedures   CBC with Differential    Standing Status:   Future    Standing Expiration Date:   06/29/2024   Comprehensive metabolic panel    Standing Status:   Future    Standing Expiration Date:   06/29/2024   Magnesium    Standing Status:   Future    Standing Expiration Date:   06/29/2024   Cancer antigen 15-3    Standing Status:   Future    Standing Expiration Date:   06/29/2024   Cancer antigen 27.29    Standing Status:   Future    Standing Expiration Date:   06/29/2024      Alben Deeds Teague,acting as a scribe for Doreatha Massed, MD.,have documented all relevant documentation on the behalf of Doreatha Massed, MD,as directed by  Doreatha Massed, MD while in the presence of Doreatha Massed,  MD.  Margaret Pyle MD, have reviewed the above documentation for accuracy and completeness, and I agree with the above.      Doreatha Massed, MD   11/14/20243:32 PM  CHIEF COMPLAINT:   Diagnosis: left breast carcinoma    Cancer Staging  Breast cancer of upper-outer quadrant of left female breast [facility]) Staging form: Breast, AJCC 8th Edition - Clinical stage from 11/10/2022: Stage IV (cT2, cN0, cM1, G2, ER+, PR-, HER2-) - Unsigned  Ductal carcinoma in situ (DCIS) of left breast Staging form: Breast, AJCC 8th Edition - Clinical stage from 11/10/2022: Stage 0 (cTis (DCIS), cN0, cM0, ER+, PR-, HER2: Not Assessed) - Unsigned    Prior Therapy: none  Current Therapy:  Anastrozole  and Ibrance    HISTORY OF PRESENT ILLNESS:   Oncology History   No history exists.     INTERVAL HISTORY:   Maria Hardy is a 82 y.o. female presenting to clinic today for follow up of left breast carcinoma. She was last seen by me on 04/26/23.  Today, she states that she is doing well overall. Her appetite level is at 40%. Her energy level is at 75%. Patient is accompanied by family. Since her last visit, she states she received injections on her bilateral hips for arthritis. She denies any infections or new pains since her last visit. She reports she is drinking adequate amounts of water. She is taking Anastrozole and Ibrance as prescribed. She is also taking Calcium and Vitamin D as prescribed.    PAST MEDICAL HISTORY:   Past Medical History: 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 breast, diagnosed 10/2022   Hypertension    Osteoporosis    Scoliosis    Vertigo     Surgical History: Past Surgical History:  Procedure Laterality Date   BREAST BIOPSY Left 11/02/2022   [place] LT BREAST BX W LOC DEV 1ST LESION IMG BX SPEC US GUIDE 11/02/2022 AP-ULTRASOUND   BREAST BIOPSY Left 11/02/2022   [place] LT BREAST BX W LOC DEV EA ADD LESION IMG BX SPEC US GUIDE 11/02/2022 AP-ULTRASOUND   thyroid tumopr remo N/A    THYROIDECTOMY      Social History: Social History   Socioeconomic History   Marital status: Widowed    Spouse name: Not on file   Number of children: Not on file   Years of education: Not on file   Highest education level: Not on file  Occupational History   Occupation: owned tobacco farm  Tobacco Use   Smoking status: Every Day    Current packs/day: 1.00    Average packs/day: 1 pack/day for 58.0 years (58.0 ttl pk-yrs)    Types: Cigarettes   Smokeless tobacco: Never  Vaping Use   Vaping status: Never Used  Substance and Sexual Activity   Alcohol use: No   Drug use: No   Sexual activity: Not on file  Other Topics  Concern   Not on file  Social History Narrative   Not on file   Social Determinants of Health   Financial Resource Strain: Not on file  Food Insecurity: No Food Insecurity (11/10/2022)   Hunger Vital Sign    Worried About Running Out of Food in the Last Year: Never true    Ran Out of Food in the Last Year: Never true  Transportation Needs: No Transportation Needs (11/10/2022)   PRAPARE - Administrator, Civil Service (Medical):  No    Lack of Transportation (Non-Medical): No  Physical Activity: Not on file  Stress: Not on file  Social Connections: Not on file  Intimate Partner Violence: Not At Risk (11/10/2022)   Humiliation, Afraid, Rape, and Kick questionnaire    Fear of Current or Ex-Partner: No    Emotionally Abused: No    Physically Abused: No    Sexually Abused: No    Family History: Family History  Problem Relation Age of Onset   Breast cancer Sister 54 - 25   Basal cell carcinoma Sister    Leukemia Son 3    Current Medications:  Current Outpatient Medications:    acetaminophen (TYLENOL) 500 MG tablet, Take 500 mg by mouth every 6 (six) hours as needed (pain.)., Disp: , Rfl:    acetaminophen-codeine (TYLENOL #3) 300-30 MG tablet, Take 1 tablet by mouth every 6 (six) hours as needed for moderate pain (pain score 4-6). Medication must last 30 days, Disp: 30 tablet, Rfl: 0   amLODipine (NORVASC) 10 MG tablet, Take 10 mg by mouth every evening., Disp: , Rfl:    anastrozole (ARIMIDEX) 1 MG tablet, Take 1 tablet by mouth once daily, Disp: 30 tablet, Rfl: 6   Ascorbic Acid (VITAMIN C PO), Take 1 tablet by mouth in the morning., Disp: , Rfl:    Cholecalciferol (VITAMIN D-3 PO), Take 1 tablet by mouth in the morning., Disp: , Rfl:    diclofenac (VOLTAREN) 75 MG EC tablet, Take 1 tablet (75 mg total) by mouth 2 (two) times daily with a meal., Disp: 60 tablet, Rfl: 2   dicyclomine (BENTYL) 10 MG capsule, Take 10 mg by mouth 3 (three) times daily., Disp: , Rfl:     gabapentin (NEURONTIN) 100 MG capsule, Take 100 mg by mouth 2 (two) times daily., Disp: , Rfl:    hydrochlorothiazide (HYDRODIURIL) 12.5 MG tablet, Take 12.5 mg by mouth every morning., Disp: , Rfl:    hydrOXYzine (ATARAX) 25 MG tablet, Take 25 mg by mouth 3 (three) times daily., Disp: , Rfl:    losartan (COZAAR) 100 MG tablet, Take 100 mg by mouth in the morning., Disp: , Rfl:    Magnesium Oxide (MAG-OX PO), Take 1 tablet by mouth in the morning., Disp: , Rfl:    meclizine (ANTIVERT) 25 MG tablet, Take 25 mg by mouth 3 (three) times daily as needed for dizziness., Disp: , Rfl:    metoprolol tartrate (LOPRESSOR) 25 MG tablet, Take 25 mg by mouth 2 (two) times daily., Disp: , Rfl:    Multiple Vitamin (MULTIVITAMIN WITH MINERALS) TABS tablet, Take 1 tablet by mouth in the morning., Disp: , Rfl:    naproxen sodium (ALEVE) 220 MG tablet, Take 220 mg by mouth daily as needed (pain.)., Disp: , Rfl:    Omega-3 Fatty Acids (FISH OIL PO), Take 1 capsule by mouth in the morning., Disp: , Rfl:    ondansetron (ZOFRAN ODT) 4 MG disintegrating tablet, Take 1 tablet (4 mg total) by mouth every 8 (eight) hours as needed., Disp: 10 tablet, Rfl: 1   palbociclib (IBRANCE) 100 MG tablet, Take 1 tablet (100 mg total) by mouth daily. Take for 21 days on, 7 days off, repeat every 28 days., Disp: 21 tablet, Rfl: 3   Allergies: No Known Allergies  REVIEW OF SYSTEMS:   Review of Systems  Constitutional:  Negative for chills, fatigue and fever.  HENT:   Negative for lump/mass, mouth sores, nosebleeds, sore throat and trouble swallowing.        +  decreased taste  Eyes:  Negative for eye problems.  Respiratory:  Negative for cough and shortness of breath.   Cardiovascular:  Negative for chest pain, leg swelling and palpitations.  Gastrointestinal:  Positive for constipation. Negative for abdominal pain, diarrhea, nausea and vomiting.  Genitourinary:  Positive for frequency. Negative for bladder incontinence, difficulty  urinating, dysuria, hematuria and nocturia.   Musculoskeletal:  Positive for arthralgias (6/10 severity, in bilateral hips). Negative for back pain, flank pain, myalgias and neck pain.  Skin:  Negative for itching and rash.  Neurological:  Negative for dizziness, headaches and numbness.  Hematological:  Does not bruise/bleed easily.  Psychiatric/Behavioral:  Negative for depression, sleep disturbance and suicidal ideas. The patient is not nervous/anxious.   All other systems reviewed and are negative.    VITALS:   Blood pressure (!) 178/81, pulse 61, temperature 98.1 F (36.7 C), temperature source Oral, resp. rate 17, height 5\' 6"  (1.676 m), weight 163 lb 4.8 oz (74.1 kg), SpO2 98%.  Wt Readings from Last 3 Encounters:  06/30/23 163 lb 4.8 oz (74.1 kg)  04/26/23 161 lb 12.8 oz (73.4 kg)  02/21/23 157 lb 6.4 oz (71.4 kg)    Body mass index is 26.36 kg/m.  Performance status (ECOG): 1 - Symptomatic but completely ambulatory  PHYSICAL EXAM:   Physical Exam Vitals and nursing note reviewed. Exam conducted with a chaperone present.  Constitutional:      Appearance: Normal appearance.  Cardiovascular:     Rate and Rhythm: Normal rate and regular rhythm.     Pulses: Normal pulses.     Heart sounds: Normal heart sounds.  Pulmonary:     Effort: Pulmonary effort is normal.     Breath sounds: Normal breath sounds.  Abdominal:     Palpations: Abdomen is soft. There is no hepatomegaly, splenomegaly or mass.     Tenderness: There is no abdominal tenderness.  Musculoskeletal:     Right lower leg: No edema.     Left lower leg: No edema.  Lymphadenopathy:     Cervical: No cervical adenopathy.     Right cervical: No superficial, deep or posterior cervical adenopathy.    Left cervical: No superficial, deep or posterior cervical adenopathy.     Upper Body:     Right upper body: No supraclavicular or axillary adenopathy.     Left upper body: No supraclavicular or axillary adenopathy.   Neurological:     General: No focal deficit present.     Mental Status: She is alert and oriented to person, place, and time.  Psychiatric:        Mood and Affect: Mood normal.        Behavior: Behavior normal.     LABS:      Latest Ref Rng & Units 06/24/2023    9:52 AM 04/14/2023    1:52 PM 02/14/2023    9:02 AM  CBC  WBC 4.0 - 10.5 K/uL 3.1  3.4  3.1   Hemoglobin 12.0 - 15.0 g/dL 9.9  40.9  81.1   Hematocrit 36.0 - 46.0 % 30.7  30.3  33.4   Platelets 150 - 400 K/uL 219  159  196       Latest Ref Rng & Units 06/24/2023    9:52 AM 04/14/2023    1:52 PM 02/14/2023    9:02 AM  CMP  Glucose 70 - 99 mg/dL 98  98  914   BUN 8 - 23 mg/dL 32  36  25  Creatinine 0.44 - 1.00 mg/dL 1.61  0.96  0.45   Sodium 135 - 145 mmol/L 137  136  136   Potassium 3.5 - 5.1 mmol/L 4.8  4.4  4.9   Chloride 98 - 111 mmol/L 106  103  105   CO2 22 - 32 mmol/L 25  26  24    Calcium 8.9 - 10.3 mg/dL 8.9  9.1  8.7   Total Protein 6.5 - 8.1 g/dL 6.4  6.9  7.0   Total Bilirubin <1.2 mg/dL 0.4  0.7  0.6   Alkaline Phos 38 - 126 U/L 55  59  56   AST 15 - 41 U/L 15  17  15    ALT 0 - 44 U/L 10  9  10       No results found for: "CEA1", "CEA" / No results found for: "CEA1", "CEA" No results found for: "PSA1" No results found for: "WUJ811" No results found for: "CAN125"  No results found for: "TOTALPROTELP", "ALBUMINELP", "A1GS", "A2GS", "BETS", "BETA2SER", "GAMS", "MSPIKE", "SPEI" Lab Results  Component Value Date   TIBC 290 01/12/2023   FERRITIN 118 01/12/2023   IRONPCTSAT 34 (H) 01/12/2023   No results found for: "LDH"   STUDIES:   DG FLUORO GUIDED NEEDLE PLC ASPIRATION/INJECTION LOC  Result Date: 06/27/2023 INDICATION: Right hip pain. EXAM: ARTHOROCENTESIS/INJECTION OF LARGE JOINT COMPARISON:  Right hip radiographs-06/05/2023 MEDICATIONS: 80 mg methylprednisolone diluted in 5 ml of 0.5% Bupivacaine. CONTRAST:  5 cc Isovue 300, administered into the right hip joint space FLUOROSCOPY TIME:  9  seconds (16.7 mGy) COMPLICATIONS: None immediate PROCEDURE: Informed written consent was obtained from the patient after discussion of the risks, benefits and alternatives to treatment. The patient was placed supine on the fluoroscopy table and the right extremity was placed in a slight degree of flexion and internal rotation and the hip was localized with fluoroscopy. The skin overlying the anterior-lateral aspect of the hip was prepped and draped in usual sterile fashion. A 22 gauge spinal needle was advanced into the hip joint at the lateral aspect of the femoral head-neck junction, after the overlying soft tissues were anesthetized with 1% lidocaine. Contrast injection confirms appropriate positioning within the hip joint. A fluoroscopic image was saved and procedural documentation purposes. Therapeutic injection was performed with the administration of 80 mg methylprednisolone diluted in 5 ml of 0.5% Bupivacaine. The needle was removed and superficial hemostasis was achieved with manual compression. A dressing was applied. The patient tolerated the procedure well without immediate postprocedural complication. IMPRESSION: Successful fluoroscopic guided therapeutic steroid injection of the right hip. Patient reported subjective improvement in both her right hip and knee pain immediately following the steroid injection. Electronically Signed   By: Simonne Come M.D.   On: 06/27/2023 12:12   DG HIP UNILAT WITH PELVIS 2-3 VIEWS RIGHT  Result Date: 06/13/2023 Groin pain right hip X-ray shows deformity of the femoral head osteophytes surrounding the femoral head and acetabulum mottling of femoral head Cannot rule out avascular necrosis no subchondral fracture joint space narrowing indicate central bone-on-bone without protrusio Femur is a type A Left hip also arthritic less disease but significant disease Degenerative changes of the lumbar spine

## 2023-06-30 NOTE — Patient Instructions (Signed)
[facility] Discharge Instructions   You were seen and examined today by Dr. Ellin Saba.  He reviewed the results of your lab work which are normal/stable. Your cancer tumor markers have improved as well.   Continue Ibrance and anastrozole as prescribed.   We will see you back in 3 months. We will repeat lab work prior to this visit.   Return as scheduled.    Thank you for choosing [facility] at [facility] to provide your oncology and hematology care.  To afford each patient quality time with our provider, please arrive at least 15 minutes before your scheduled appointment time.   If you have a lab appointment with the Cancer Center please come in thru the Main Entrance and check in at the main information desk.  You need to re-schedule your appointment should you arrive 10 or more minutes late.  We strive to give you quality time with our providers, and arriving late affects you and other patients whose appointments are after yours.  Also, if you no show three or more times for appointments you may be dismissed from the clinic at the providers discretion.     Again, thank you for choosing [facility].  Our hope is that these requests will decrease the amount of time that you wait before being seen by our physicians.       _____________________________________________________________  Should you have questions after your visit to [facility], please contact our office at [phone] and follow the prompts.  Our office hours are 8:00 a.m. and 4:30 p.m. Monday - Friday.  Please note that voicemails left after 4:00 p.m. may not be returned until the following business day.  We are closed weekends and major holidays.  You do have access to a nurse 24-7, just call the main number to the clinic [phone] and do not press any options, hold on the line and a nurse will answer the phone.    For prescription  refill requests, have your pharmacy contact our office and allow 72 hours.    Due to Covid, you will need to wear a mask upon entering the hospital. If you do not have a mask, a mask will be given to you at the Main Entrance upon arrival. For doctor visits, patients may have 1 support person age 39 or older with them. For treatment visits, patients can not have anyone with them due to social distancing guidelines and our immunocompromised population.

## 2023-06-30 NOTE — Progress Notes (Signed)
Patient is taking Ibrance as prescribed.  She has not missed any doses and reports no side effects at this time.   

## 2023-07-13 ENCOUNTER — Other Ambulatory Visit: Payer: Self-pay | Admitting: Orthopedic Surgery

## 2023-07-13 DIAGNOSIS — M1611 Unilateral primary osteoarthritis, right hip: Secondary | ICD-10-CM

## 2023-07-26 ENCOUNTER — Telehealth: Payer: Self-pay | Admitting: Orthopedic Surgery

## 2023-07-26 NOTE — Telephone Encounter (Signed)
Dr. Mort Sawyers pt - spoke w/the patient, she stated she got a call from our office stating that she was being referred to [facility].  She wants to know who she is being referred to and why.  [phone]

## 2023-07-26 NOTE — Telephone Encounter (Signed)
She needs pain meds Dr Romeo Apple referred to pain management in [place]  I called her to advise. She voiced understanding and will call to schedule

## 2023-07-28 ENCOUNTER — Telehealth: Payer: Self-pay | Admitting: Orthopedic Surgery

## 2023-07-28 NOTE — Telephone Encounter (Signed)
I am not a nurse, but I called her gave her the number.  [phone] ext [phone]  She left message yesterday afternoon, but says she was finally able to get in touch with them late afternoon. She is scheduled I have already closed the referral.

## 2023-07-28 NOTE — Telephone Encounter (Signed)
Dr. Mort Sawyers pt - pt lvm stating she would like Dr. Mort Sawyers nurse to call her w/the number to the pain management clinic that he is sending her to. [phone]

## 2023-08-01 ENCOUNTER — Other Ambulatory Visit: Payer: Self-pay

## 2023-08-01 NOTE — Progress Notes (Signed)
Specialty Pharmacy Refill Coordination Note  Maria Hardy is a 82 y.o. female contacted today regarding refills of specialty medication(s) Palbociclib [name])   Patient requested Delivery   Delivery date: 08/09/23   Verified address: [address] [place] [address]   Medication will be filled on 08/08/23.

## 2023-08-08 ENCOUNTER — Other Ambulatory Visit: Payer: Self-pay

## 2023-08-29 ENCOUNTER — Other Ambulatory Visit: Payer: Self-pay

## 2023-08-29 NOTE — Progress Notes (Signed)
 Specialty Pharmacy Ongoing Clinical Assessment Note  Maria Hardy is a 83 y.o. female who is being followed by the specialty pharmacy service for RxSp Oncology   Patient's specialty medication(s) reviewed today: Palbociclib  (IBRANCE )   Missed doses in the last 4 weeks: 0   Patient/Caregiver did not have any additional questions or concerns.   Therapeutic benefit summary: Unable to assess   Adverse events/side effects summary: No adverse events/side effects   Patient's therapy is appropriate to: Continue    Goals Addressed             This Visit's Progress    Stabilization of disease       Patient is on track. Patient will maintain adherence         Follow up:  6 months  Mitzie GORMAN [facility] Specialty Pharmacist

## 2023-08-29 NOTE — Progress Notes (Signed)
 Specialty Pharmacy Refill Coordination Note  Maria Hardy is a 83 y.o. female contacted today regarding refills of specialty medication(s) Palbociclib  (IBRANCE )   Patient requested Delivery   Delivery date: 09/07/23   Verified address: [address]   Medication will be filled on 09/06/23.

## 2023-09-06 ENCOUNTER — Encounter: Payer: Self-pay | Admitting: Hematology

## 2023-09-15 ENCOUNTER — Other Ambulatory Visit: Payer: Self-pay

## 2023-09-15 NOTE — Progress Notes (Signed)
Opened in error

## 2023-09-28 ENCOUNTER — Other Ambulatory Visit (HOSPITAL_COMMUNITY): Payer: Self-pay

## 2023-09-28 ENCOUNTER — Other Ambulatory Visit: Payer: Self-pay

## 2023-09-28 ENCOUNTER — Other Ambulatory Visit: Payer: Self-pay | Admitting: Hematology

## 2023-09-28 DIAGNOSIS — Z170 Estrogen receptor positive status [ER+]: Secondary | ICD-10-CM

## 2023-09-28 MED ORDER — PALBOCICLIB 100 MG PO TABS
100.0000 mg | ORAL_TABLET | Freq: Every day | ORAL | 3 refills | Status: DC
  Filled 2023-09-28: qty 21, 28d supply, fill #0
  Filled 2023-10-25: qty 21, 28d supply, fill #1
  Filled 2023-11-22: qty 21, 28d supply, fill #2
  Filled 2023-12-22: qty 21, 28d supply, fill #3

## 2023-09-28 NOTE — Progress Notes (Signed)
Specialty Pharmacy Refill Coordination Note  Maria Hardy is a 83 y.o. female contacted today regarding refills of specialty medication(s) Palbociclib [name])   Patient requested Delivery   Delivery date: 10/05/23   Verified address: [address]   Medication will be filled on 02.18.25.   This fill date is pending response to refill request from provider. Patient is aware and if they have not received fill by intended date they must follow up with pharmacy.

## 2023-09-29 ENCOUNTER — Encounter: Payer: Self-pay | Admitting: Hematology

## 2023-10-03 ENCOUNTER — Other Ambulatory Visit: Payer: Self-pay

## 2023-10-03 NOTE — Progress Notes (Signed)
Patient was contacted today that due to possible impending winter storm, medication will arrive on Tuesday 10/04/23

## 2023-10-06 ENCOUNTER — Inpatient Hospital Stay: Payer: 59

## 2023-10-10 ENCOUNTER — Inpatient Hospital Stay: Payer: 59 | Attending: Hematology

## 2023-10-10 DIAGNOSIS — Z170 Estrogen receptor positive status [ER+]: Secondary | ICD-10-CM | POA: Diagnosis not present

## 2023-10-10 DIAGNOSIS — F17210 Nicotine dependence, cigarettes, uncomplicated: Secondary | ICD-10-CM | POA: Insufficient documentation

## 2023-10-10 DIAGNOSIS — Z79811 Long term (current) use of aromatase inhibitors: Secondary | ICD-10-CM | POA: Diagnosis not present

## 2023-10-10 DIAGNOSIS — C50412 Malignant neoplasm of upper-outer quadrant of left female breast: Secondary | ICD-10-CM | POA: Insufficient documentation

## 2023-10-10 DIAGNOSIS — M810 Age-related osteoporosis without current pathological fracture: Secondary | ICD-10-CM | POA: Insufficient documentation

## 2023-10-10 LAB — COMPREHENSIVE METABOLIC PANEL
ALT: 8 U/L (ref 0–44)
AST: 15 U/L (ref 15–41)
Albumin: 3.8 g/dL (ref 3.5–5.0)
Alkaline Phosphatase: 52 U/L (ref 38–126)
Anion gap: 9 (ref 5–15)
BUN: 32 mg/dL — ABNORMAL HIGH (ref 8–23)
CO2: 24 mmol/L (ref 22–32)
Calcium: 9.6 mg/dL (ref 8.9–10.3)
Chloride: 105 mmol/L (ref 98–111)
Creatinine, Ser: 1.74 mg/dL — ABNORMAL HIGH (ref 0.44–1.00)
GFR, Estimated: 29 mL/min — ABNORMAL LOW (ref >60)
Glucose, Bld: 90 mg/dL (ref 70–99)
Potassium: 4.5 mmol/L (ref 3.5–5.1)
Sodium: 138 mmol/L (ref 135–145)
Total Bilirubin: 0.5 mg/dL (ref 0.0–1.2)
Total Protein: 6.8 g/dL (ref 6.5–8.1)

## 2023-10-10 LAB — CBC WITH DIFFERENTIAL/PLATELET
Abs Immature Granulocytes: 0.01 10*3/uL (ref 0.00–0.07)
Basophils Absolute: 0 10*3/uL (ref 0.0–0.1)
Basophils Relative: 0 %
Eosinophils Absolute: 0 10*3/uL (ref 0.0–0.5)
Eosinophils Relative: 1 %
HCT: 29.9 % — ABNORMAL LOW (ref 36.0–46.0)
Hemoglobin: 10 g/dL — ABNORMAL LOW (ref 12.0–15.0)
Immature Granulocytes: 0 %
Lymphocytes Relative: 37 %
Lymphs Abs: 1.1 10*3/uL (ref 0.7–4.0)
MCH: 35.8 pg — ABNORMAL HIGH (ref 26.0–34.0)
MCHC: 33.4 g/dL (ref 30.0–36.0)
MCV: 107.2 fL — ABNORMAL HIGH (ref 80.0–100.0)
Monocytes Absolute: 0.4 10*3/uL (ref 0.1–1.0)
Monocytes Relative: 13 %
Neutro Abs: 1.5 10*3/uL — ABNORMAL LOW (ref 1.7–7.7)
Neutrophils Relative %: 49 %
Platelets: 178 10*3/uL (ref 150–400)
RBC: 2.79 MIL/uL — ABNORMAL LOW (ref 3.87–5.11)
RDW: 12.7 % (ref 11.5–15.5)
WBC: 3 10*3/uL — ABNORMAL LOW (ref 4.0–10.5)
nRBC: 0 % (ref 0.0–0.2)

## 2023-10-10 LAB — MAGNESIUM: Magnesium: 2.1 mg/dL (ref 1.7–2.4)

## 2023-10-11 LAB — CANCER ANTIGEN 27.29: CA 27.29: 40 U/mL — ABNORMAL HIGH (ref 0.0–38.6)

## 2023-10-11 LAB — CANCER ANTIGEN 15-3: CA 15-3: 28 U/mL — ABNORMAL HIGH (ref 0.0–25.0)

## 2023-10-13 ENCOUNTER — Inpatient Hospital Stay (HOSPITAL_BASED_OUTPATIENT_CLINIC_OR_DEPARTMENT_OTHER): Payer: 59 | Admitting: Hematology

## 2023-10-13 VITALS — BP 169/81 | HR 54 | Temp 97.6°F | Resp 20 | Wt 160.1 lb

## 2023-10-13 DIAGNOSIS — C50412 Malignant neoplasm of upper-outer quadrant of left female breast: Secondary | ICD-10-CM | POA: Diagnosis not present

## 2023-10-13 DIAGNOSIS — Z170 Estrogen receptor positive status [ER+]: Secondary | ICD-10-CM

## 2023-10-13 NOTE — Progress Notes (Signed)
Patient is taking Ibrance as prescribed.  She has not missed any doses and reports no side effects at this time.   

## 2023-10-13 NOTE — Progress Notes (Signed)
 [facility] [address] [place] [address]    Clinic Day:  10/13/2023  Referring physician: Marylynn Pearson, FNP  Patient Care Team: Marylynn Pearson, FNP as PCP - General (Family Medicine) Doreatha Massed, MD as Medical Oncologist (Medical Oncology)   ASSESSMENT & PLAN:   Assessment: 1.  Stage IV (T2 N0 G2, ER/PR+, HER2-) left breast UOQ IDC: - She noticed left breast mass in the first week of March. - Mammogram (10/21/2022): Irregular hypoechoic mass in the left breast at 12:30 position 4 CFN measuring 3.1 x 2.1 x 3 cm.  Mass extends to the level of skin.  There is a subtle shadowing mass in the left breast at the 12:30 position 7 CFN measuring 0.4 x 0.7 x 0.8 cm.  No axillary adenopathy. - Biopsy (11/02/2022): Grade 2 IDC, ER 70% moderate strong, PR 0%, HER2 1+, Ki-67 20%. - Ibrance 75 mg 3 weeks on/1 week off and anastrozole started on 12/03/2022.   2.  Left breast DCIS: - Biopsy (11/02/2022): DCIS, ER 90% strong staining, PR 0%   3.  Social/family history: - She is widowed 9 years ago and lives with her youngest son at home.  She does not drive but is independent of ADLs and some IADLs.  She worked on her tobacco form prior to retirement.  Current active smoker, 1 pack/day, started smoking at age 60. - Sister had breast cancer in her 74s.  Another sister had basal cell skin cancer.    Plan: 1.  Stage IV (T2 N0 G2, ER/PR+, HER2-) left breast UOQ IDC: - Last PET scan in September 2024 showed improving left breast mass with focal mild hypermetabolism in the left perihilar region.  Previously seen thoracic lymphadenopathy resolved. - She is tolerating Ibrance and anastrozole very well. - Labs from 10/10/2023: Normal LFTs.  Mild leukopenia from Ibrance is stable.  Platelet count is normal.  Mild macrocytic anemia is also stable. - CA 15-3 is down to 28 and CA 27-29 is down to 40. - Continue Ibrance 100 mg 21 days on/7 days off.  Continue anastrozole 1 mg  daily.  RTC 3 months for follow-up.  Will plan on repeating PET scan and tumor markers prior to next visit.  Will also check ferritin and iron panel at that time.   2.  Osteoporosis (DEXA 09/15/2022 T-score -3): -Last dose of Prolia on 06/23/2024.  Denies any side effects from it. - Labs today: Calcium 9.6.  Continue calcium and vitamin D supplements.  Continue Prolia every 6 months.   3.  CKD: - Creatinine is stable at 1.74.  Continue to follow-up with Dr. Allena Katz of nephrology.   4.  Right hip and right knee pain: - Continue follow-up with Dr. Romeo Apple for severe arthritic pain of right hip.    Orders Placed This Encounter  Procedures   NM PET Image Restag (PS) Skull Base To Thigh    Standing Status:   Future    Expected Date:   01/10/2024    Expiration Date:   10/12/2024    If indicated for the ordered procedure, I authorize the administration of a radiopharmaceutical per Radiology protocol:   Yes    Preferred imaging location?:   [facility]    Release to patient:   Immediate   CBC with Differential    Standing Status:   Future    Expected Date:   01/12/2024    Expiration Date:   10/12/2024   Comprehensive metabolic panel  Standing Status:   Future    Expected Date:   01/12/2024    Expiration Date:   10/12/2024   Cancer antigen 15-3    Standing Status:   Future    Expected Date:   01/12/2024    Expiration Date:   10/12/2024   Cancer antigen 27.29    Standing Status:   Future    Expected Date:   01/12/2024    Expiration Date:   10/12/2024   Iron and TIBC ([facility] ONLY)    Standing Status:   Future    Expected Date:   01/12/2024    Expiration Date:   10/12/2024   Ferritin    Standing Status:   Future    Expected Date:   01/12/2024    Expiration Date:   10/12/2024      Mikeal Hawthorne R Teague,acting as a scribe for Doreatha Massed, MD.,have documented all relevant documentation on the behalf of Doreatha Massed, MD,as directed by  Doreatha Massed, MD while  in the presence of Doreatha Massed, MD.  I, Doreatha Massed MD, have reviewed the above documentation for accuracy and completeness, and I agree with the above.       Doreatha Massed, MD   2/27/20253:33 PM  CHIEF COMPLAINT:   Diagnosis: left breast carcinoma    Cancer Staging  Breast cancer of upper-outer quadrant of left female breast [facility]) Staging form: Breast, AJCC 8th Edition - Clinical stage from 11/10/2022: Stage IV (cT2, cN0, cM1, G2, ER+, PR-, HER2-) - Unsigned  Ductal carcinoma in situ (DCIS) of left breast Staging form: Breast, AJCC 8th Edition - Clinical stage from 11/10/2022: Stage 0 (cTis (DCIS), cN0, cM0, ER+, PR-, HER2: Not Assessed) - Unsigned    Prior Therapy: none  Current Therapy:  Anastrozole and Ibrance    HISTORY OF PRESENT ILLNESS:   Oncology History   No history exists.     INTERVAL HISTORY:   Maria Hardy is a 83 y.o. female presenting to clinic today for follow up of left breast carcinoma. She was last seen by me on 06/30/23.  Today, she states that she is doing well overall. Her appetite level is at 50%. Her energy level is at 75%. Patient is accompanied by family. She is taking Anastrozole and Ibrance as prescribed and denies any side effects. Maria Hardy denies any infections in the last few months. She states she has decreased taste and decreased appetite. She denies any jaw issues or dental pain. Pakou reports she has back pain. She is taking Calcium and Vitamin D supplements.   PAST MEDICAL HISTORY:   Past Medical History: 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 breast, diagnosed 10/2022   Hypertension    Osteoporosis    Scoliosis    Vertigo     Surgical History: Past Surgical History:  Procedure Laterality Date   BREAST BIOPSY Left 11/02/2022   [place] LT BREAST BX W LOC DEV 1ST LESION IMG BX SPEC US GUIDE 11/02/2022 [facility]-UL[facility]ASOUND   BREAST BIOPSY Left 11/02/2022   [place] LT  BREAST BX W LOC DEV EA ADD LESION IMG BX SPEC US GUIDE 11/02/2022 [facility]-ULTRASOUND   thyroid tumopr remo N/A    THYROIDECTOMY      Social History: Social History   Socioeconomic History   Marital status: Widowed    Spouse name: Not on file   Number of children: Not on file   Years of education: Not on file  Highest education level: Not on file  Occupational History   Occupation: owned tobacco farm  Tobacco Use   Smoking status: Every Day    Current packs/day: 1.00    Average packs/day: 1 pack/day for 58.0 years (58.0 ttl pk-yrs)    Types: Cigarettes   Smokeless tobacco: Never  Vaping Use   Vaping status: Never Used  Substance and Sexual Activity   Alcohol use: No   Drug use: No   Sexual activity: Not on file  Other Topics Concern   Not on file  Social History Narrative   Not on file   Social Drivers of Health   Financial Resource Strain: Not on file  Food Insecurity: No Food Insecurity (11/10/2022)   Hunger Vital Sign    Worried About Running Out of Food in the Last Year: Never true    Ran Out of Food in the Last Year: Never true  Transportation Needs: No Transportation Needs (11/10/2022)   PRAPARE - Administrator, Civil Service (Medical): No    Lack of Transportation (Non-Medical): No  Physical Activity: Not on file  Stress: Not on file  Social Connections: Not on file  Intimate Partner Violence: Not At Risk (11/10/2022)   Humiliation, Afraid, Rape, and Kick questionnaire    Fear of Current or Ex-Partner: No    Emotionally Abused: No    Physically Abused: No    Sexually Abused: No    Family History: Family History  Problem Relation Age of Onset   Breast cancer Sister 50 - 58   Basal cell carcinoma Sister    Leukemia Son 3    Current Medications:  Current Outpatient Medications:    acetaminophen (TYLENOL) 500 MG tablet, Take 500 mg by mouth every 6 (six) hours as needed (pain.)., Disp: , Rfl:    acetaminophen-codeine (TYLENOL #3) 300-30 MG  tablet, Take 1 tablet by mouth every 6 (six) hours as needed for moderate pain (pain score 4-6). Medication must last 30 days, Disp: 30 tablet, Rfl: 0   amLODipine (NORVASC) 10 MG tablet, Take 10 mg by mouth every evening., Disp: , Rfl:    anastrozole (ARIMIDEX) 1 MG tablet, Take 1 tablet by mouth once daily, Disp: 30 tablet, Rfl: 6   Ascorbic Acid (VITAMIN C PO), Take 1 tablet by mouth in the morning., Disp: , Rfl:    Cholecalciferol (VITAMIN D-3 PO), Take 1 tablet by mouth in the morning., Disp: , Rfl:    diclofenac (VOLTAREN) 75 MG EC tablet, Take 1 tablet (75 mg total) by mouth 2 (two) times daily with a meal., Disp: 60 tablet, Rfl: 2   dicyclomine (BENTYL) 10 MG capsule, Take 10 mg by mouth 3 (three) times daily., Disp: , Rfl:    FEROSUL 325 (65 Fe) MG tablet, Take 325 mg by mouth daily., Disp: , Rfl:    hydrochlorothiazide (HYDRODIURIL) 12.5 MG tablet, Take 12.5 mg by mouth every morning., Disp: , Rfl:    HYDROcodone-acetaminophen (NORCO) 7.5-325 MG tablet, Take 0.5-1 tablets by mouth 4 (four) times daily as needed., Disp: , Rfl:    hydrOXYzine (ATARAX) 25 MG tablet, Take 25 mg by mouth 3 (three) times daily., Disp: , Rfl:    losartan (COZAAR) 100 MG tablet, Take 100 mg by mouth in the morning., Disp: , Rfl:    Magnesium Oxide (MAG-OX PO), Take 1 tablet by mouth in the morning., Disp: , Rfl:    meclizine (ANTIVERT) 25 MG tablet, Take 25 mg by mouth 3 (three) times daily as  needed for dizziness., Disp: , Rfl:    metoprolol tartrate (LOPRESSOR) 25 MG tablet, Take 25 mg by mouth 2 (two) times daily., Disp: , Rfl:    Multiple Vitamin (MULTIVITAMIN WITH MINERALS) TABS tablet, Take 1 tablet by mouth in the morning., Disp: , Rfl:    naproxen sodium (ALEVE) 220 MG tablet, Take 220 mg by mouth daily as needed (pain.)., Disp: , Rfl:    Omega-3 Fatty Acids (FISH OIL PO), Take 1 capsule by mouth in the morning., Disp: , Rfl:    ondansetron (ZOFRAN ODT) 4 MG disintegrating tablet, Take 1 tablet (4 mg  total) by mouth every 8 (eight) hours as needed., Disp: 10 tablet, Rfl: 1   palbociclib (IBRANCE) 100 MG tablet, Take 1 tablet (100 mg total) by mouth daily. Take for 21 days on, 7 days off, repeat every 28 days., Disp: 21 tablet, Rfl: 3   folic acid (FOLVITE) 1 MG tablet, Take 1 mg by mouth daily., Disp: , Rfl:    gabapentin (NEURONTIN) 300 MG capsule, take one capsule by mouth three times daily for pain, Disp: , Rfl:    Allergies: No Known Allergies  REVIEW OF SYSTEMS:   Review of Systems  Constitutional:  Negative for chills, fatigue and fever.  HENT:   Negative for lump/mass, mouth sores, nosebleeds, sore throat and trouble swallowing.        +decreased taste  Eyes:  Negative for eye problems.  Respiratory:  Negative for cough and shortness of breath.   Cardiovascular:  Negative for chest pain, leg swelling and palpitations.  Gastrointestinal:  Negative for abdominal pain, constipation, diarrhea, nausea and vomiting.  Genitourinary:  Positive for nocturia. Negative for bladder incontinence, difficulty urinating, dysuria, frequency and hematuria.   Musculoskeletal:  Positive for back pain (8/10 severity). Negative for arthralgias, flank pain, myalgias and neck pain.  Skin:  Negative for itching and rash.  Neurological:  Negative for dizziness, headaches and numbness.  Hematological:  Does not bruise/bleed easily.  Psychiatric/Behavioral:  Positive for sleep disturbance. Negative for depression and suicidal ideas. The patient is not nervous/anxious.   All other systems reviewed and are negative.    VITALS:   Blood pressure (!) 169/81, pulse (!) 54, temperature 97.6 F (36.4 C), temperature source Tympanic, resp. rate 20, weight 160 lb 0.9 oz (72.6 kg), SpO2 100%.  Wt Readings from Last 3 Encounters:  10/13/23 160 lb 0.9 oz (72.6 kg)  06/30/23 163 lb 4.8 oz (74.1 kg)  04/26/23 161 lb 12.8 oz (73.4 kg)    Body mass index is 25.83 kg/m.  Performance status (ECOG): 1 -  Symptomatic but completely ambulatory  PHYSICAL EXAM:   Physical Exam Vitals and nursing note reviewed. Exam conducted with a chaperone present.  Constitutional:      Appearance: Normal appearance.  Cardiovascular:     Rate and Rhythm: Normal rate and regular rhythm.     Pulses: Normal pulses.     Heart sounds: Normal heart sounds.  Pulmonary:     Effort: Pulmonary effort is normal.     Breath sounds: Normal breath sounds.  Abdominal:     Palpations: Abdomen is soft. There is no hepatomegaly, splenomegaly or mass.     Tenderness: There is no abdominal tenderness.  Musculoskeletal:     Right lower leg: No edema.     Left lower leg: No edema.  Lymphadenopathy:     Cervical: No cervical adenopathy.     Right cervical: No superficial, deep or posterior cervical adenopathy.  Left cervical: No superficial, deep or posterior cervical adenopathy.     Upper Body:     Right upper body: No supraclavicular or axillary adenopathy.     Left upper body: No supraclavicular or axillary adenopathy.  Neurological:     General: No focal deficit present.     Mental Status: She is alert and oriented to person, place, and time.  Psychiatric:        Mood and Affect: Mood normal.        Behavior: Behavior normal.     LABS:      Latest Ref Rng & Units 10/10/2023    2:12 PM 06/24/2023    9:52 AM 04/14/2023    1:52 PM  CBC  WBC 4.0 - 10.5 K/uL 3.0  3.1  3.4   Hemoglobin 12.0 - 15.0 g/dL 56.3  9.9  87.5   Hematocrit 36.0 - 46.0 % 29.9  30.7  30.3   Platelets 150 - 400 K/uL 178  219  159       Latest Ref Rng & Units 10/10/2023    2:12 PM 06/24/2023    9:52 AM 04/14/2023    1:52 PM  CMP  Glucose 70 - 99 mg/dL 90  98  98   BUN 8 - 23 mg/dL 32  32  36   Creatinine 0.44 - 1.00 mg/dL 6.43  3.29  5.18   Sodium 135 - 145 mmol/L 138  137  136   Potassium 3.5 - 5.1 mmol/L 4.5  4.8  4.4   Chloride 98 - 111 mmol/L 105  106  103   CO2 22 - 32 mmol/L 24  25  26    Calcium 8.9 - 10.3 mg/dL 9.6  8.9   9.1   Total Protein 6.5 - 8.1 g/dL 6.8  6.4  6.9   Total Bilirubin 0.0 - 1.2 mg/dL 0.5  0.4  0.7   Alkaline Phos 38 - 126 U/L 52  55  59   AST 15 - 41 U/L 15  15  17    ALT 0 - 44 U/L 8  10  9       No results found for: "CEA1", "CEA" / No results found for: "CEA1", "CEA" No results found for: "PSA1" No results found for: "ACZ660" No results found for: "CAN125"  No results found for: "TOTALPROTELP", "ALBUMINELP", "A1GS", "A2GS", "BETS", "BETA2SER", "GAMS", "MSPIKE", "SPEI" Lab Results  Component Value Date   TIBC 290 01/12/2023   FERRITIN 118 01/12/2023   IRONPCTSAT 34 (H) 01/12/2023   No results found for: "LDH"   STUDIES:   No results found.

## 2023-10-13 NOTE — Patient Instructions (Addendum)
 [facility] at [facility] Discharge Instructions   You were seen and examined today by Dr. Ellin Saba.  He reviewed the results of your lab work which are normal/stable. Your tumor markers are coming down which is good.   Continue Ibrance and anastrozole as prescribed.   We will see you back in . We will repeat lab work prior to this visit.   Return as scheduled.    Thank you for choosing [facility] at [facility] to provide your oncology and hematology care.  To afford each patient quality time with our provider, please arrive at least 15 minutes before your scheduled appointment time.   If you have a lab appointment with the Cancer Center please come in thru the Main Entrance and check in at the main information desk.  You need to re-schedule your appointment should you arrive 10 or more minutes late.  We strive to give you quality time with our providers, and arriving late affects you and other patients whose appointments are after yours.  Also, if you no show three or more times for appointments you may be dismissed from the clinic at the providers discretion.     Again, thank you for choosing [facility].  Our hope is that these requests will decrease the amount of time that you wait before being seen by our physicians.       _____________________________________________________________  Should you have questions after your visit to [facility], please contact our office at [phone] and follow the prompts.  Our office hours are 8:00 a.m. and 4:30 p.m. Monday - Friday.  Please note that voicemails left after 4:00 p.m. may not be returned until the following business day.  We are closed weekends and major holidays.  You do have access to a nurse 24-7, just call the main number to the clinic [phone] and do not press any options, hold on the line and a nurse will answer the phone.    For prescription refill  requests, have your pharmacy contact our office and allow 72 hours.    Due to Covid, you will need to wear a mask upon entering the hospital. If you do not have a mask, a mask will be given to you at the Main Entrance upon arrival. For doctor visits, patients may have 1 support person age 29 or older with them. For treatment visits, patients can not have anyone with them due to social distancing guidelines and our immunocompromised population.

## 2023-10-25 ENCOUNTER — Other Ambulatory Visit: Payer: Self-pay

## 2023-10-25 NOTE — Progress Notes (Signed)
 Specialty Pharmacy Refill Coordination Note  Maria Hardy is a 83 y.o. female contacted today regarding refills of specialty medication(s) Palbociclib [name])   Patient requested Delivery   Delivery date: 11/01/23   Verified address: [address]   Medication will be filled on 10/31/23.

## 2023-10-31 ENCOUNTER — Other Ambulatory Visit: Payer: Self-pay

## 2023-11-21 ENCOUNTER — Other Ambulatory Visit: Payer: Self-pay

## 2023-11-22 ENCOUNTER — Other Ambulatory Visit (HOSPITAL_COMMUNITY): Payer: Self-pay

## 2023-11-22 ENCOUNTER — Other Ambulatory Visit: Payer: Self-pay

## 2023-11-22 NOTE — Progress Notes (Signed)
 Specialty Pharmacy Ongoing Clinical Assessment Note  Maria Hardy is a 83 y.o. female who is being followed by the specialty pharmacy service for RxSp Oncology   Patient's specialty medication(s) reviewed today: Palbociclib [name])   Missed doses in the last 4 weeks: 0   Patient/Caregiver did not have any additional questions or concerns.   Therapeutic benefit summary: Patient is achieving benefit   Adverse events/side effects summary: No adverse events/side effects   Patient's therapy is appropriate to: Continue    Goals Addressed             This Visit's Progress    Slow Disease Progression       Patient is on track. Patient will maintain adherence.  CA 27.29 and CA 15-3 continue to slowly decline.         Follow up:  6 months  Servando Snare Specialty Pharmacist

## 2023-11-22 NOTE — Progress Notes (Signed)
 Specialty Pharmacy Refill Coordination Note  Maria Hardy is a 83 y.o. female contacted today regarding refills of specialty medication(s) Palbociclib [name])   Patient requested Delivery   Delivery date: 11/30/23   Verified address: [address], [place], [place]   Medication will be filled on 11/29/23.

## 2023-12-06 ENCOUNTER — Other Ambulatory Visit: Payer: Self-pay

## 2023-12-16 ENCOUNTER — Other Ambulatory Visit: Payer: Self-pay

## 2023-12-16 MED ORDER — ANASTROZOLE 1 MG PO TABS: 1.0000 mg | ORAL_TABLET | Freq: Every day | ORAL | 6 refills | Status: AC

## 2023-12-22 ENCOUNTER — Other Ambulatory Visit: Payer: Self-pay

## 2023-12-22 NOTE — Progress Notes (Signed)
 Specialty Pharmacy Refill Coordination Note  Maria Hardy is a 83 y.o. female contacted today regarding refills of specialty medication(s) Palbociclib  (IBRANCE )   Patient requested Delivery   Delivery date: 12/28/23   Verified address: [address], [place], [place]   Medication will be filled on 05.13.25.

## 2024-01-05 ENCOUNTER — Inpatient Hospital Stay: Payer: 59 | Attending: Hematology

## 2024-01-05 ENCOUNTER — Ambulatory Visit (HOSPITAL_COMMUNITY)
Admission: RE | Admit: 2024-01-05 | Discharge: 2024-01-05 | Disposition: A | Discharge status: Home / Self Care | Payer: 59 | Source: Ambulatory Visit | Attending: Hematology | Admitting: Hematology

## 2024-01-05 DIAGNOSIS — M25561 Pain in right knee: Secondary | ICD-10-CM | POA: Diagnosis not present

## 2024-01-05 DIAGNOSIS — Z79811 Long term (current) use of aromatase inhibitors: Secondary | ICD-10-CM | POA: Diagnosis not present

## 2024-01-05 DIAGNOSIS — M810 Age-related osteoporosis without current pathological fracture: Secondary | ICD-10-CM | POA: Insufficient documentation

## 2024-01-05 DIAGNOSIS — C50412 Malignant neoplasm of upper-outer quadrant of left female breast: Secondary | ICD-10-CM | POA: Diagnosis present

## 2024-01-05 DIAGNOSIS — Z170 Estrogen receptor positive status [ER+]: Secondary | ICD-10-CM | POA: Insufficient documentation

## 2024-01-05 DIAGNOSIS — M25551 Pain in right hip: Secondary | ICD-10-CM | POA: Insufficient documentation

## 2024-01-05 DIAGNOSIS — F17210 Nicotine dependence, cigarettes, uncomplicated: Secondary | ICD-10-CM | POA: Insufficient documentation

## 2024-01-05 DIAGNOSIS — E041 Nontoxic single thyroid nodule: Secondary | ICD-10-CM | POA: Insufficient documentation

## 2024-01-05 LAB — IRON AND TIBC
Iron: 109 ug/dL (ref 28–170)
Saturation Ratios: 35 % — ABNORMAL HIGH (ref 10.4–31.8)
TIBC: 315 ug/dL (ref 250–450)
UIBC: 206 ug/dL

## 2024-01-05 LAB — COMPREHENSIVE METABOLIC PANEL WITH GFR
ALT: 8 U/L (ref 0–44)
AST: 14 U/L — ABNORMAL LOW (ref 15–41)
Albumin: 4.2 g/dL (ref 3.5–5.0)
Alkaline Phosphatase: 70 U/L (ref 38–126)
Anion gap: 7 (ref 5–15)
BUN: 28 mg/dL — ABNORMAL HIGH (ref 8–23)
CO2: 26 mmol/L (ref 22–32)
Calcium: 9.5 mg/dL (ref 8.9–10.3)
Chloride: 103 mmol/L (ref 98–111)
Creatinine, Ser: 1.56 mg/dL — ABNORMAL HIGH (ref 0.44–1.00)
GFR, Estimated: 33 mL/min — ABNORMAL LOW (ref >60)
Glucose, Bld: 103 mg/dL — ABNORMAL HIGH (ref 70–99)
Potassium: 4.6 mmol/L (ref 3.5–5.1)
Sodium: 136 mmol/L (ref 135–145)
Total Bilirubin: 0.9 mg/dL (ref 0.0–1.2)
Total Protein: 7.3 g/dL (ref 6.5–8.1)

## 2024-01-05 LAB — CBC WITH DIFFERENTIAL/PLATELET
Abs Immature Granulocytes: 0 10*3/uL (ref 0.00–0.07)
Basophils Absolute: 0 10*3/uL (ref 0.0–0.1)
Basophils Relative: 0 %
Eosinophils Absolute: 0 10*3/uL (ref 0.0–0.5)
Eosinophils Relative: 0 %
HCT: 33.4 % — ABNORMAL LOW (ref 36.0–46.0)
Hemoglobin: 11.3 g/dL — ABNORMAL LOW (ref 12.0–15.0)
Lymphocytes Relative: 44 %
Lymphs Abs: 1.4 10*3/uL (ref 0.7–4.0)
MCH: 36.6 pg — ABNORMAL HIGH (ref 26.0–34.0)
MCHC: 33.8 g/dL (ref 30.0–36.0)
MCV: 108.1 fL — ABNORMAL HIGH (ref 80.0–100.0)
Monocytes Absolute: 0.1 10*3/uL (ref 0.1–1.0)
Monocytes Relative: 3 %
Neutro Abs: 1.7 10*3/uL (ref 1.7–7.7)
Neutrophils Relative %: 53 %
Platelets: 171 10*3/uL (ref 150–400)
RBC: 3.09 MIL/uL — ABNORMAL LOW (ref 3.87–5.11)
RDW: 13.1 % (ref 11.5–15.5)
Smear Review: NORMAL
WBC: 3.2 10*3/uL — ABNORMAL LOW (ref 4.0–10.5)
nRBC: 0 % (ref 0.0–0.2)

## 2024-01-05 LAB — FERRITIN: Ferritin: 147 ng/mL (ref 11–307)

## 2024-01-05 MED ORDER — FLUDEOXYGLUCOSE F - 18 (FDG) INJECTION
8.3900 | Freq: Once | INTRAVENOUS | Status: AC | PRN
  Administered 2024-01-05: 8.39 via INTRAVENOUS

## 2024-01-06 LAB — CANCER ANTIGEN 27.29: CA 27.29: 55.1 U/mL — ABNORMAL HIGH (ref 0.0–38.6)

## 2024-01-06 LAB — CANCER ANTIGEN 15-3: CA 15-3: 33.9 U/mL — ABNORMAL HIGH (ref 0.0–25.0)

## 2024-01-12 ENCOUNTER — Inpatient Hospital Stay

## 2024-01-12 ENCOUNTER — Inpatient Hospital Stay (HOSPITAL_BASED_OUTPATIENT_CLINIC_OR_DEPARTMENT_OTHER): Payer: 59 | Admitting: Hematology

## 2024-01-12 VITALS — BP 114/83 | HR 53 | Temp 98.2°F | Resp 18 | Wt 162.0 lb

## 2024-01-12 DIAGNOSIS — C50412 Malignant neoplasm of upper-outer quadrant of left female breast: Secondary | ICD-10-CM

## 2024-01-12 DIAGNOSIS — M810 Age-related osteoporosis without current pathological fracture: Secondary | ICD-10-CM | POA: Diagnosis not present

## 2024-01-12 DIAGNOSIS — Z170 Estrogen receptor positive status [ER+]: Secondary | ICD-10-CM

## 2024-01-12 MED ORDER — DENOSUMAB 60 MG/ML ~~LOC~~ SOSY
60.0000 mg | PREFILLED_SYRINGE | SUBCUTANEOUS | Status: DC
  Administered 2024-01-12: 60 mg via SUBCUTANEOUS
  Filled 2024-01-12: qty 1

## 2024-01-12 NOTE — Progress Notes (Signed)
Patient is taking Ibrance as prescribed.  She has not missed any doses and reports no side effects at this time.   

## 2024-01-12 NOTE — Patient Instructions (Addendum)
 [facility] at [facility] Discharge Instructions   You were seen and examined today by Dr. Cheree Cords.  He reviewed the results of your lab work which are normal/stable.   He reviewed the results of your PET. The cancer has improved.   Continue Ibrance  and anastrozole  as prescribed.   We will see you back in 4 months. We will repeat lab work prior to this visit.   Return as scheduled.    Thank you for choosing [facility] at [facility] to provide your oncology and hematology care.  To afford each patient quality time with our provider, please arrive at least 15 minutes before your scheduled appointment time.   If you have a lab appointment with the Cancer Center please come in thru the Main Entrance and check in at the main information desk.  You need to re-schedule your appointment should you arrive 10 or more minutes late.  We strive to give you quality time with our providers, and arriving late affects you and other patients whose appointments are after yours.  Also, if you no show three or more times for appointments you may be dismissed from the clinic at the providers discretion.     Again, thank you for choosing [facility].  Our hope is that these requests will decrease the amount of time that you wait before being seen by our physicians.       _____________________________________________________________  Should you have questions after your visit to [facility], please contact our office at [phone] and follow the prompts.  Our office hours are 8:00 a.m. and 4:30 p.m. Monday - Friday.  Please note that voicemails left after 4:00 p.m. may not be returned until the following business day.  We are closed weekends and major holidays.  You do have access to a nurse 24-7, just call the main number to the clinic [phone] and do not press any options, hold on the line and a nurse will answer the phone.     For prescription refill requests, have your pharmacy contact our office and allow 72 hours.    Due to Covid, you will need to wear a mask upon entering the hospital. If you do not have a mask, a mask will be given to you at the Main Entrance upon arrival. For doctor visits, patients may have 1 support person age 52 or older with them. For treatment visits, patients can not have anyone with them due to social distancing guidelines and our immunocompromised population.

## 2024-01-12 NOTE — Progress Notes (Signed)
 Labs reviewed with MD and treatment team. Patient tolerated Prolia  injection with no complaints voiced.  Site clean and dry with no bruising or swelling noted at site.  See MAR for details.  Band aid applied.  Patient stable during and after injection.  Vss with discharge and left in satisfactory condition with no s/s of distress noted. All follow ups as scheduled.   Maria Hardy

## 2024-01-12 NOTE — Progress Notes (Signed)
 [facility] [address] [place] [address]    Clinic Day:  01/12/2024  Referring physician: Jace Martinet, FNP  Patient Care Team: Maria Martinet, FNP as PCP - General (Family Medicine) Maria Boros, MD as Medical Oncologist (Medical Oncology)   ASSESSMENT & PLAN:   Assessment: 1.  Stage IV (T2 N0 G2, ER/PR+, HER2-) left breast UOQ IDC: - She noticed left breast mass in the first week of March. - Mammogram (10/21/2022): Irregular hypoechoic mass in the left breast at 12:30 position 4 CFN measuring 3.1 x 2.1 x 3 cm.  Mass extends to the level of skin.  There Hardy a subtle shadowing mass in the left breast at the 12:30 position 7 CFN measuring 0.4 x 0.7 x 0.8 cm.  No axillary adenopathy. - Biopsy (11/02/2022): Grade 2 IDC, ER 70% moderate strong, PR 0%, HER2 1+, Ki-67 20%. - Ibrance  75 mg 3 weeks on/1 week off and anastrozole  started on 12/03/2022.   2.  Left breast DCIS: - Biopsy (11/02/2022): DCIS, ER 90% strong staining, PR 0%   3.  Social/family history: - She Hardy widowed 9 years ago and lives with her youngest son at home.  She does not drive but Hardy independent of ADLs and some IADLs.  She worked on her tobacco form prior to retirement.  Current active smoker, 1 pack/day, started smoking at age 42. - Sister had breast cancer in her 21s.  Another sister had basal cell skin cancer.    Plan: 1.  Stage IV (T2 N0 G2, ER/PR+, HER2-) left breast UOQ IDC: - She Hardy tolerating Ibrance  reasonably well.  She reports drowsiness with anastrozole . - Reviewed labs from 01/06/2024: Normal LFTs.  CBC with mild leukopenia and normal ANC.  Platelet count Hardy normal.  Hemoglobin Hardy 11.3.  Ferritin Hardy 147.  CA 15-3 and CA 27-29 has increased to 33.9 and 55 from previous values on 10/10/2023. - PET scan (01/05/2024): Left breast nodule and mediastinal/hilar lymph nodes have decreased in size and hypermetabolism.  We also discussed about the left thyroid  nodule measuring 1.9 cm  and other benign findings. - Given her age and comorbidities and has stage IV breast cancer, we have discussed and decided to do watchful waiting at this time instead of ultrasound/biopsy of the thyroid  nodule. - Continue Ibrance  100 mg 3 weeks on/1 week off.  Take anastrozole  at bedtime.  RTC 4 months for follow-up with repeat tumor markers and labs.  May consider repeat imaging with CT CAP in 6 months.   2.  Osteoporosis (DEXA 09/15/2022 T-score -3): - She Hardy taking calcium and vitamin D  supplements.  Calcium level Hardy 9.5.  She denies any dental issues.  Recommend continuing Prolia  every 6 months.   3.  CKD: - Creatinine Hardy stable at 1.56.  She has an appointment to see Dr. Carrolyn Hardy on June 23.   4.  Right hip and right knee pain: - Continue to follow-up with Dr. Phyllis Hardy for right hip pain.  She Hardy taking Tylenol .  I have recommended that she avoid NSAIDs given her CKD.    No orders of the defined types were placed in this encounter.     Maria Hardy,acting as a Neurosurgeon for Maria Boros, MD.,have documented all relevant documentation on the behalf of Maria Boros, MD,as directed by  Maria Boros, MD while in the presence of Maria Boros, MD.  I, Maria Boros MD, have reviewed the above documentation for accuracy and completeness, and I agree with  the above.      Maria Boros, MD   5/29/20254:13 PM  CHIEF COMPLAINT:   Diagnosis: left breast carcinoma    Cancer Staging  Breast cancer of upper-outer quadrant of left female breast [facility]) Staging form: Breast, AJCC 8th Edition - Clinical stage from 11/10/2022: Stage IV (cT2, cN0, cM1, G2, ER+, PR-, HER2-) - Unsigned  Ductal carcinoma in situ (DCIS) of left breast Staging form: Breast, AJCC 8th Edition - Clinical stage from 11/10/2022: Stage 0 (cTis (DCIS), cN0, cM0, ER+, PR-, HER2: Not Assessed) - Unsigned    Prior Therapy: none  Current Therapy:  Anastrozole  and Ibrance      HISTORY OF PRESENT ILLNESS:   Oncology History   No history exists.     INTERVAL HISTORY:   Maria Hardy a 83 y.o. female presenting to clinic today for follow up of left breast carcinoma. She was last seen by me on 10/13/23.  Since her last visit, she underwent restaging PET on 01/05/24 that found: Left breast nodule and mediastinal/hilar lymph nodes have decreased in size and hypermetabolism, compatible with treatment response. No evidence of new metastatic disease. Hypermetabolic 1.9 cm posterior left thyroid  nodule. Suspect mild subpleural reticulation in the lungs, raising suspicion for interstitial lung disease. Cholelithiasis. Aortic atherosclerosis. Coronary artery calcification. Enlarged pulmonic trunk, indicative of pulmonary arterial hypertension.  Today, she states that she Hardy doing well overall. Her appetite level Hardy at 50%. Her energy level Hardy at 50%. Patient Hardy accompanied by family.   She reports Anastrozole  causes her drowsiness. She Hardy otherwise tolerating anastrozole  and Ibrance  well. Maria Hardy denies missing any doses. She denies any side effects from Prolia  injections.   Maria Hardy notes severe vertigo. She Hardy smoking 0.75 ppd and taking calcium and vitamin D  as prescribed.  She reports nocturia and difficulty sleeping. Maria Hardy has an appointment with Dr. Carrolyn Hardy on 02/06/24.   She notes easily bruising. She Hardy taking voltaren , which may cause this symptom.   PAST MEDICAL HISTORY:   Past Medical History: 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 breast, diagnosed 10/2022   Hypertension    Osteoporosis    Scoliosis    Vertigo     Surgical History: Past Surgical History:  Procedure Laterality Date   BREAST BIOPSY Left 11/02/2022   US  LT BREAST BX W LOC DEV 1ST LESION IMG BX SPEC US  GUIDE 11/02/2022 [facility]-ULTRASOUND   BREAST BIOPSY Left 11/02/2022   US  LT BREAST BX W LOC DEV EA ADD LESION IMG BX SPEC US  GUIDE  11/02/2022 AP-ULTRASOUND   thyroid  tumopr remo N/A    THYROIDECTOMY      Social History: Social History   Socioeconomic History   Marital status: Widowed    Spouse name: Not on file   Number of children: Not on file   Years of education: Not on file   Highest education level: Not on file  Occupational History   Occupation: owned tobacco farm  Tobacco Use   Smoking status: Every Day    Current packs/day: 1.00    Average packs/day: 1 pack/day for 58.0 years (58.0 ttl pk-yrs)    Types: Cigarettes   Smokeless tobacco: Never  Vaping Use   Vaping status: Never Used  Substance and Sexual Activity   Alcohol use: No   Drug use: No   Sexual activity: Not on file  Other Topics Concern   Not on file  Social History Narrative  Not on file   Social Drivers of Health   Financial Resource Strain: Not on file  Food Insecurity: No Food Insecurity (11/10/2022)   Hunger Vital Sign    Worried About Running Out of Food in the Last Year: Never true    Ran Out of Food in the Last Year: Never true  Transportation Needs: No Transportation Needs (11/10/2022)   PRAPARE - Administrator, Civil Service (Medical): No    Lack of Transportation (Non-Medical): No  Physical Activity: Not on file  Stress: Not on file  Social Connections: Not on file  Intimate Partner Violence: Not At Risk (11/10/2022)   Humiliation, Afraid, Rape, and Kick questionnaire    Fear of Current or Ex-Partner: No    Emotionally Abused: No    Physically Abused: No    Sexually Abused: No    Family History: Family History  Problem Relation Age of Onset   Breast cancer Sister 66 - 75   Basal cell carcinoma Sister    Leukemia Son 3    Current Medications:  Current Outpatient Medications:    acetaminophen  (TYLENOL ) 500 MG tablet, Take 500 mg by mouth every 6 (six) hours as needed (pain.)., Disp: , Rfl:    acetaminophen -codeine  (TYLENOL  #3) 300-30 MG tablet, Take 1 tablet by mouth every 6 (six) hours as  needed for moderate pain (pain score 4-6). Medication must last 30 days, Disp: 30 tablet, Rfl: 0   amLODipine (NORVASC) 10 MG tablet, Take 10 mg by mouth every evening., Disp: , Rfl:    anastrozole  (ARIMIDEX ) 1 MG tablet, Take 1 tablet (1 mg total) by mouth daily., Disp: 30 tablet, Rfl: 6   Ascorbic Acid (VITAMIN C PO), Take 1 tablet by mouth in the morning., Disp: , Rfl:    Cholecalciferol (VITAMIN D -3 PO), Take 1 tablet by mouth in the morning., Disp: , Rfl:    diclofenac  (VOLTAREN ) 75 MG EC tablet, Take 1 tablet (75 mg total) by mouth 2 (two) times daily with a meal., Disp: 60 tablet, Rfl: 2   dicyclomine (BENTYL) 10 MG capsule, Take 10 mg by mouth 3 (three) times daily., Disp: , Rfl:    FEROSUL 325 (65 Fe) MG tablet, Take 325 mg by mouth daily., Disp: , Rfl:    folic acid  (FOLVITE ) 1 MG tablet, Take 1 mg by mouth daily., Disp: , Rfl:    gabapentin (NEURONTIN) 300 MG capsule, take one capsule by mouth three times daily for pain, Disp: , Rfl:    hydrochlorothiazide (HYDRODIURIL) 12.5 MG tablet, Take 12.5 mg by mouth every morning., Disp: , Rfl:    HYDROcodone-acetaminophen  (NORCO) 7.5-325 MG tablet, Take 0.5-1 tablets by mouth 4 (four) times daily as needed., Disp: , Rfl:    hydrOXYzine (ATARAX) 25 MG tablet, Take 25 mg by mouth 3 (three) times daily., Disp: , Rfl:    losartan (COZAAR) 100 MG tablet, Take 100 mg by mouth in the morning., Disp: , Rfl:    Magnesium Oxide (MAG-OX PO), Take 1 tablet by mouth in the morning., Disp: , Rfl:    meclizine  (ANTIVERT ) 25 MG tablet, Take 25 mg by mouth 3 (three) times daily as needed for dizziness., Disp: , Rfl:    metoprolol tartrate (LOPRESSOR) 25 MG tablet, Take 25 mg by mouth 2 (two) times daily., Disp: , Rfl:    Multiple Vitamin (MULTIVITAMIN WITH MINERALS) TABS tablet, Take 1 tablet by mouth in the morning., Disp: , Rfl:    Omega-3 Fatty Acids (FISH OIL PO), Take  1 capsule by mouth in the morning., Disp: , Rfl:    ondansetron  (ZOFRAN  ODT) 4 MG  disintegrating tablet, Take 1 tablet (4 mg total) by mouth every 8 (eight) hours as needed., Disp: 10 tablet, Rfl: 1   palbociclib  (IBRANCE ) 100 MG tablet, Take 1 tablet (100 mg total) by mouth daily. Take for 21 days on, 7 days off, repeat every 28 days., Disp: 21 tablet, Rfl: 3 No current facility-administered medications for this visit.  Facility-Administered Medications Ordered in Other Visits:    denosumab  (PROLIA ) injection 60 mg, 60 mg, Subcutaneous, Q6 months, Quintavia Rogstad, MD, 60 mg at 01/12/24 1530   Allergies: No Known Allergies  REVIEW OF SYSTEMS:   Review of Systems  Constitutional:  Negative for chills, fatigue and fever.  HENT:   Negative for lump/mass, mouth sores, nosebleeds, sore throat and trouble swallowing.   Eyes:  Negative for eye problems.  Respiratory:  Negative for cough and shortness of breath.   Cardiovascular:  Negative for chest pain, leg swelling and palpitations.  Gastrointestinal:  Positive for diarrhea and vomiting. Negative for abdominal pain, constipation and nausea.  Genitourinary:  Positive for nocturia. Negative for bladder incontinence, difficulty urinating, dysuria, frequency and hematuria.   Musculoskeletal:  Positive for back pain (10/10 severity). Negative for arthralgias, flank pain, myalgias and neck pain.  Skin:  Negative for itching and rash.  Neurological:  Negative for dizziness, headaches and numbness.       +vertigo  Hematological:  Bruises/bleeds easily.  Psychiatric/Behavioral:  Positive for sleep disturbance. Negative for depression and suicidal ideas. The patient Hardy not nervous/anxious.   All other systems reviewed and are negative.    VITALS:   Blood pressure 114/83, pulse (!) 53, temperature 98.2 F (36.8 C), temperature source Oral, resp. rate 18, weight 162 lb 0.6 oz (73.5 kg), SpO2 100%.  Wt Readings from Last 3 Encounters:  01/12/24 162 lb 0.6 oz (73.5 kg)  10/13/23 160 lb 0.9 oz (72.6 kg)  06/30/23 163 lb 4.8  oz (74.1 kg)    Body mass index Hardy 26.15 kg/m.  Performance status (ECOG): 1 - Symptomatic but completely ambulatory  PHYSICAL EXAM:   Physical Exam Vitals and nursing note reviewed. Exam conducted with a chaperone present.  Constitutional:      Appearance: Normal appearance.  Cardiovascular:     Rate and Rhythm: Normal rate and regular rhythm.     Pulses: Normal pulses.     Heart sounds: Normal heart sounds.  Pulmonary:     Effort: Pulmonary effort Hardy normal.     Breath sounds: Normal breath sounds.  Abdominal:     Palpations: Abdomen Hardy soft. There Hardy no hepatomegaly, splenomegaly or mass.     Tenderness: There Hardy no abdominal tenderness.  Musculoskeletal:     Right lower leg: No edema.     Left lower leg: No edema.  Lymphadenopathy:     Cervical: No cervical adenopathy.     Right cervical: No superficial, deep or posterior cervical adenopathy.    Left cervical: No superficial, deep or posterior cervical adenopathy.     Upper Body:     Right upper body: No supraclavicular or axillary adenopathy.     Left upper body: No supraclavicular or axillary adenopathy.  Neurological:     General: No focal deficit present.     Mental Status: She Hardy alert and oriented to person, place, and time.  Psychiatric:        Mood and Affect: Mood normal.  Behavior: Behavior normal.     LABS:      Latest Ref Rng & Units 01/05/2024   10:49 AM 10/10/2023    2:12 PM 06/24/2023    9:52 AM  CBC  WBC 4.0 - 10.5 K/uL 3.2  3.0  3.1   Hemoglobin 12.0 - 15.0 g/dL 16.1  09.6  9.9   Hematocrit 36.0 - 46.0 % 33.4  29.9  30.7   Platelets 150 - 400 K/uL 171  178  219       Latest Ref Rng & Units 01/05/2024   10:49 AM 10/10/2023    2:12 PM 06/24/2023    9:52 AM  CMP  Glucose 70 - 99 mg/dL 045  90  98   BUN 8 - 23 mg/dL 28  32  32   Creatinine 0.44 - 1.00 mg/dL 4.09  8.11  9.14   Sodium 135 - 145 mmol/L 136  138  137   Potassium 3.5 - 5.1 mmol/L 4.6  4.5  4.8   Chloride 98 - 111 mmol/L  103  105  106   CO2 22 - 32 mmol/L 26  24  25    Calcium 8.9 - 10.3 mg/dL 9.5  9.6  8.9   Total Protein 6.5 - 8.1 g/dL 7.3  6.8  6.4   Total Bilirubin 0.0 - 1.2 mg/dL 0.9  0.5  0.4   Alkaline Phos 38 - 126 U/L 70  52  55   AST 15 - 41 U/L 14  15  15    ALT 0 - 44 U/L 8  8  10       No results found for: "CEA1", "CEA" / No results found for: "CEA1", "CEA" No results found for: "PSA1" No results found for: "NWG956" No results found for: "CAN125"  No results found for: "TOTALPROTELP", "ALBUMINELP", "A1GS", "A2GS", "BETS", "BETA2SER", "GAMS", "MSPIKE", "SPEI" Lab Results  Component Value Date   TIBC 315 01/05/2024   TIBC 290 01/12/2023   FERRITIN 147 01/05/2024   FERRITIN 118 01/12/2023   IRONPCTSAT 35 (H) 01/05/2024   IRONPCTSAT 34 (H) 01/12/2023   No results found for: "LDH"   STUDIES:   NM PET Image Restag (PS) Skull Base To Thigh Result Date: 01/11/2024 CLINICAL DATA:  Subsequent treatment strategy for breast cancer. EXAM: NUCLEAR MEDICINE PET SKULL BASE TO THIGH TECHNIQUE: 8.4 mCi F-18 FDG was injected intravenously. Full-ring PET imaging was performed from the skull base to thigh after the radiotracer. CT data was obtained and used for attenuation correction and anatomic localization. Fasting blood glucose: 118 mg/dl COMPARISON:  21/30/8657. FINDINGS: Mediastinal blood pool activity: SUV max 2.8 Liver activity: SUV max NA NECK: No abnormal hypermetabolism. Incidental CT findings: None. CHEST: Focal hypermetabolism associated with a 1.9 cm posterior left thyroid  nodule, SUV max 9.2. Improving mediastinal and bihilar hypermetabolism. Residual subcarinal lymph node measures approximately 6 mm (202/77), SUV max 5.6, compared to 1.9 cm and SUV max 7.0. 2.0 cm lateral left breast nodule, SUV max 2.7, decreased from 2.5 cm and SUV max 4.9. No additional abnormal hypermetabolism. Incidental CT findings: Dominant low-attenuation left thyroid  nodule measures 3.5 cm. Rightward shift of the  trachea. Atherosclerotic calcification of the aorta, aortic valve and coronary arteries. Enlarged pulmonic trunk and heart. No pericardial or pleural effusion. There may be mild subpleural reticulation in the lungs. ABDOMEN/PELVIS: No abnormal hypermetabolism. Incidental CT findings: Tiny gallstones. Small low-attenuation lesions in the left kidney. No specific follow-up necessary. Liver, gallbladder, adrenal glands, kidneys, spleen, pancreas, stomach and bowel are  otherwise grossly unremarkable. SKELETON: Degenerative uptake in the right hip. No abnormal hypermetabolism. Incidental CT findings: Degenerative changes in the spine and hips. IMPRESSION: 1. Left breast nodule and mediastinal/hilar lymph nodes have decreased in size and hypermetabolism, compatible with treatment response. No evidence of new metastatic disease. 2. Hypermetabolic 1.9 cm posterior left thyroid  nodule. Thyroid  ultrasound and biopsy are at the discretion of the referring provider, given patient's age and comorbidities. (Ref: J Am Coll Radiol. 2015 Feb;12(2): 143-50). 3. Suspect mild subpleural reticulation in the lungs, raising suspicion for interstitial lung disease. 4. Cholelithiasis. 5. Aortic atherosclerosis (ICD10-I70.0). Coronary artery calcification. 6. Enlarged pulmonic trunk, indicative of pulmonary arterial hypertension. Electronically Signed   By: Shearon Denis M.D.   On: 01/11/2024 09:52

## 2024-01-18 ENCOUNTER — Encounter: Payer: Self-pay | Admitting: Licensed Clinical Social Worker

## 2024-01-18 DIAGNOSIS — Z1379 Encounter for other screening for genetic and chromosomal anomalies: Secondary | ICD-10-CM | POA: Insufficient documentation

## 2024-01-23 ENCOUNTER — Other Ambulatory Visit: Payer: Self-pay | Admitting: *Deleted

## 2024-01-23 ENCOUNTER — Other Ambulatory Visit: Payer: Self-pay | Admitting: Hematology

## 2024-01-23 ENCOUNTER — Other Ambulatory Visit: Payer: Self-pay

## 2024-01-23 DIAGNOSIS — C50412 Malignant neoplasm of upper-outer quadrant of left female breast: Secondary | ICD-10-CM

## 2024-01-23 MED ORDER — PALBOCICLIB 100 MG PO TABS
100.0000 mg | ORAL_TABLET | Freq: Every day | ORAL | 3 refills | Status: DC
  Filled 2024-01-23: qty 21, 28d supply, fill #0
  Filled 2024-02-14: qty 21, 28d supply, fill #1
  Filled 2024-03-20: qty 21, 28d supply, fill #2
  Filled 2024-04-10: qty 21, 28d supply, fill #3

## 2024-01-23 NOTE — Progress Notes (Signed)
 Specialty Pharmacy Refill Coordination Note  Maria Hardy is a 83 y.o. female contacted today regarding refills of specialty medication(s) Palbociclib  (IBRANCE )   Patient requested Delivery   Delivery date: 01/26/24   Verified address: [address], [place], [place]   Medication will be filled on 06.10.25 or 06.11.25.   This fill date is pending response to refill request from provider. Patient is aware and if they have not received fill by intended date they must follow up with pharmacy.

## 2024-02-09 ENCOUNTER — Telehealth: Payer: Self-pay | Admitting: Orthopedic Surgery

## 2024-02-09 NOTE — Telephone Encounter (Signed)
 DR. MARGRETTE   Patient called lvm wants to change appt.  I called the patient back unable to leave message mailbox full

## 2024-02-10 ENCOUNTER — Ambulatory Visit: Admitting: Orthopedic Surgery

## 2024-02-14 ENCOUNTER — Other Ambulatory Visit: Payer: Self-pay

## 2024-02-15 ENCOUNTER — Other Ambulatory Visit: Payer: Self-pay

## 2024-02-15 ENCOUNTER — Other Ambulatory Visit: Payer: Self-pay | Admitting: Pharmacy Technician

## 2024-02-15 NOTE — Progress Notes (Signed)
 Specialty Pharmacy Refill Coordination Note  Maria Hardy is a 83 y.o. female contacted today regarding refills of specialty medication(s) Palbociclib  (IBRANCE )   Patient requested Delivery   Delivery date: 02/21/24   Verified address: [address]  [place] [address]   Medication will be filled on 02/20/24.

## 2024-02-24 ENCOUNTER — Ambulatory Visit (INDEPENDENT_AMBULATORY_CARE_PROVIDER_SITE_OTHER): Admitting: Orthopedic Surgery

## 2024-02-24 ENCOUNTER — Encounter: Payer: Self-pay | Admitting: Orthopedic Surgery

## 2024-02-24 DIAGNOSIS — M1711 Unilateral primary osteoarthritis, right knee: Secondary | ICD-10-CM

## 2024-02-24 DIAGNOSIS — M25551 Pain in right hip: Secondary | ICD-10-CM

## 2024-02-24 DIAGNOSIS — M545 Low back pain, unspecified: Secondary | ICD-10-CM

## 2024-02-24 DIAGNOSIS — G8929 Other chronic pain: Secondary | ICD-10-CM

## 2024-02-24 DIAGNOSIS — M1611 Unilateral primary osteoarthritis, right hip: Secondary | ICD-10-CM

## 2024-02-24 MED ORDER — HYDROCODONE-ACETAMINOPHEN 5-325 MG PO TABS: 1.0000 | ORAL_TABLET | Freq: Four times a day (QID) | ORAL | 0 refills | Status: DC | PRN

## 2024-02-24 MED ORDER — METHYLPREDNISOLONE ACETATE 40 MG/ML IJ SUSP
40.0000 mg | Freq: Once | INTRAMUSCULAR | Status: AC
  Administered 2024-02-24: 40 mg via INTRA_ARTICULAR

## 2024-02-24 MED ORDER — PREDNISONE 10 MG (48) PO TBPK: ORAL_TABLET | Freq: Every day | ORAL | 0 refills | Status: DC

## 2024-02-24 NOTE — Patient Instructions (Signed)

## 2024-02-24 NOTE — Progress Notes (Signed)
 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  She is interested in getting hyaluronic acid injections  Evaluation of the right hip shows tenderness along the posterior aspect of the lumbar region radiating into the buttocks and lateral side of the leg consistent with lumbar spine pathology  As far as the right knee goes she continues to have pain in the right knee and would like an injection   Injection given right knee  Procedure note right knee injection   verbal consent was obtained to inject right knee joint  Timeout was completed to confirm the site of injection  The medications used were depomedrol 40 mg and 1% lidocaine  3 cc Anesthesia was provided by ethyl chloride and the skin was prepped with alcohol.  After cleaning the skin with alcohol a 20-gauge needle was used to inject the right knee joint. There were no complications. A sterile bandage was applied.    Recommended oral analgesic and prednisone  for back pain  Meds ordered this encounter  Medications   methylPREDNISolone  acetate (DEPO-MEDROL ) injection 40 mg   predniSONE  (STERAPRED UNI-PAK 48 TAB) 10 MG (48) TBPK tablet    Sig: Take by mouth daily. 10 mg ds 12 days as directed    Dispense:  48 tablet    Refill:  0   HYDROcodone -acetaminophen  (NORCO/VICODIN) 5-325 MG tablet    Sig: Take 1 tablet by mouth every 6 (six) hours as needed for moderate pain (pain score 4-6).    Dispense:  30 tablet    Refill:  0

## 2024-02-24 NOTE — Progress Notes (Signed)
 Has questions about hyaluronic  acid injections

## 2024-03-09 ENCOUNTER — Other Ambulatory Visit: Payer: Self-pay

## 2024-03-12 ENCOUNTER — Other Ambulatory Visit (HOSPITAL_COMMUNITY): Payer: Self-pay

## 2024-03-20 ENCOUNTER — Other Ambulatory Visit: Payer: Self-pay | Admitting: *Deleted

## 2024-03-20 ENCOUNTER — Other Ambulatory Visit: Payer: Self-pay

## 2024-03-20 DIAGNOSIS — C50412 Malignant neoplasm of upper-outer quadrant of left female breast: Secondary | ICD-10-CM

## 2024-03-20 NOTE — Progress Notes (Signed)
 Specialty Pharmacy Refill Coordination Note  Maria Hardy is a 83 y.o. female contacted today regarding refills of specialty medication(s) Palbociclib  (IBRANCE )   Patient requested Delivery   Delivery date: 03/21/24   Verified address: [address]  [place] [address]   Medication will be filled on 03/20/24.

## 2024-03-21 ENCOUNTER — Telehealth: Payer: Self-pay

## 2024-03-21 NOTE — Telephone Encounter (Signed)
 VOB has been submitted for Durolane, right knee

## 2024-04-10 ENCOUNTER — Other Ambulatory Visit: Payer: Self-pay | Admitting: Pharmacy Technician

## 2024-04-10 ENCOUNTER — Other Ambulatory Visit: Payer: Self-pay

## 2024-04-10 NOTE — Progress Notes (Signed)
 Specialty Pharmacy Refill Coordination Note  Maria Hardy is a 83 y.o. female contacted today regarding refills of specialty medication(s) Palbociclib  (IBRANCE )   Patient requested Delivery   Delivery date: 04/19/24   Verified address: [address]   [place] [address]   Medication will be filled on 04/18/24. Break on 9/1 & resume on 9/9.

## 2024-04-19 ENCOUNTER — Telehealth: Payer: Self-pay | Admitting: Orthopedic Surgery

## 2024-04-19 ENCOUNTER — Telehealth: Payer: Self-pay

## 2024-04-19 NOTE — Telephone Encounter (Signed)
 Please schedule patient 1 appt.for gel injection with Dr. Margrette.  Gel information has been added to referral

## 2024-04-19 NOTE — Telephone Encounter (Signed)
 Called the patient to schedule her gel injection, mailbox is full and she doesn't have mychart.  Approved for Durolane, right knee Buy & Bill Patient will be responsible for 20% OOP No Co-pay No PA required

## 2024-05-09 ENCOUNTER — Other Ambulatory Visit: Payer: Self-pay

## 2024-05-09 ENCOUNTER — Other Ambulatory Visit: Payer: Self-pay | Admitting: *Deleted

## 2024-05-09 DIAGNOSIS — D649 Anemia, unspecified: Secondary | ICD-10-CM

## 2024-05-09 DIAGNOSIS — C50412 Malignant neoplasm of upper-outer quadrant of left female breast: Secondary | ICD-10-CM

## 2024-05-09 DIAGNOSIS — D500 Iron deficiency anemia secondary to blood loss (chronic): Secondary | ICD-10-CM

## 2024-05-09 DIAGNOSIS — D0512 Intraductal carcinoma in situ of left breast: Secondary | ICD-10-CM

## 2024-05-10 ENCOUNTER — Encounter: Payer: Self-pay | Admitting: Oncology

## 2024-05-10 ENCOUNTER — Inpatient Hospital Stay: Attending: Oncology

## 2024-05-10 DIAGNOSIS — D500 Iron deficiency anemia secondary to blood loss (chronic): Secondary | ICD-10-CM | POA: Insufficient documentation

## 2024-05-10 DIAGNOSIS — Z170 Estrogen receptor positive status [ER+]: Secondary | ICD-10-CM | POA: Insufficient documentation

## 2024-05-10 DIAGNOSIS — C50412 Malignant neoplasm of upper-outer quadrant of left female breast: Secondary | ICD-10-CM | POA: Diagnosis present

## 2024-05-10 DIAGNOSIS — D0512 Intraductal carcinoma in situ of left breast: Secondary | ICD-10-CM

## 2024-05-10 DIAGNOSIS — D649 Anemia, unspecified: Secondary | ICD-10-CM

## 2024-05-10 DIAGNOSIS — R978 Other abnormal tumor markers: Secondary | ICD-10-CM | POA: Insufficient documentation

## 2024-05-10 LAB — CBC WITH DIFFERENTIAL/PLATELET
Abs Immature Granulocytes: 0 K/uL (ref 0.00–0.07)
Basophils Absolute: 0 K/uL (ref 0.0–0.1)
Basophils Relative: 2 %
Eosinophils Absolute: 0 K/uL (ref 0.0–0.5)
Eosinophils Relative: 1 %
HCT: 29.8 % — ABNORMAL LOW (ref 36.0–46.0)
Hemoglobin: 10 g/dL — ABNORMAL LOW (ref 12.0–15.0)
Immature Granulocytes: 0 %
Lymphocytes Relative: 31 %
Lymphs Abs: 0.8 K/uL (ref 0.7–4.0)
MCH: 36.1 pg — ABNORMAL HIGH (ref 26.0–34.0)
MCHC: 33.6 g/dL (ref 30.0–36.0)
MCV: 107.6 fL — ABNORMAL HIGH (ref 80.0–100.0)
Monocytes Absolute: 0.3 K/uL (ref 0.1–1.0)
Monocytes Relative: 10 %
Neutro Abs: 1.5 K/uL — ABNORMAL LOW (ref 1.7–7.7)
Neutrophils Relative %: 56 %
Platelets: 163 K/uL (ref 150–400)
RBC: 2.77 MIL/uL — ABNORMAL LOW (ref 3.87–5.11)
RDW: 13.1 % (ref 11.5–15.5)
Smear Review: NORMAL
WBC: 2.6 K/uL — ABNORMAL LOW (ref 4.0–10.5)
nRBC: 0 % (ref 0.0–0.2)

## 2024-05-10 LAB — COMPREHENSIVE METABOLIC PANEL WITH GFR
ALT: 8 U/L (ref 0–44)
AST: 16 U/L (ref 15–41)
Albumin: 3.9 g/dL (ref 3.5–5.0)
Alkaline Phosphatase: 50 U/L (ref 38–126)
Anion gap: 11 (ref 5–15)
BUN: 24 mg/dL — ABNORMAL HIGH (ref 8–23)
CO2: 20 mmol/L — ABNORMAL LOW (ref 22–32)
Calcium: 8.4 mg/dL — ABNORMAL LOW (ref 8.9–10.3)
Chloride: 109 mmol/L (ref 98–111)
Creatinine, Ser: 1.55 mg/dL — ABNORMAL HIGH (ref 0.44–1.00)
GFR, Estimated: 33 mL/min — ABNORMAL LOW (ref >60)
Glucose, Bld: 98 mg/dL (ref 70–99)
Potassium: 4.3 mmol/L (ref 3.5–5.1)
Sodium: 140 mmol/L (ref 135–145)
Total Bilirubin: 0.7 mg/dL (ref 0.0–1.2)
Total Protein: 6.4 g/dL — ABNORMAL LOW (ref 6.5–8.1)

## 2024-05-10 LAB — IRON AND TIBC
Iron: 86 ug/dL (ref 28–170)
Saturation Ratios: 31 % (ref 10.4–31.8)
TIBC: 279 ug/dL (ref 250–450)
UIBC: 193 ug/dL

## 2024-05-10 LAB — FERRITIN: Ferritin: 112 ng/mL (ref 11–307)

## 2024-05-11 LAB — CANCER ANTIGEN 27.29: CA 27.29: 37.5 U/mL (ref 0.0–38.6)

## 2024-05-11 LAB — CANCER ANTIGEN 15-3: CA 15-3: 28.3 U/mL — ABNORMAL HIGH (ref 0.0–25.0)

## 2024-05-14 ENCOUNTER — Other Ambulatory Visit: Payer: Self-pay | Admitting: Hematology

## 2024-05-14 ENCOUNTER — Other Ambulatory Visit: Payer: Self-pay

## 2024-05-14 DIAGNOSIS — C50412 Malignant neoplasm of upper-outer quadrant of left female breast: Secondary | ICD-10-CM

## 2024-05-16 ENCOUNTER — Other Ambulatory Visit: Payer: Self-pay | Admitting: Oncology

## 2024-05-16 ENCOUNTER — Other Ambulatory Visit: Payer: Self-pay

## 2024-05-16 ENCOUNTER — Other Ambulatory Visit (HOSPITAL_COMMUNITY): Payer: Self-pay

## 2024-05-16 DIAGNOSIS — C50412 Malignant neoplasm of upper-outer quadrant of left female breast: Secondary | ICD-10-CM

## 2024-05-16 MED ORDER — PALBOCICLIB 100 MG PO TABS
100.0000 mg | ORAL_TABLET | Freq: Every day | ORAL | 3 refills | Status: DC
  Filled 2024-05-16: qty 21, 28d supply, fill #0
  Filled 2024-06-07: qty 21, 28d supply, fill #1
  Filled 2024-07-04: qty 21, 28d supply, fill #2
  Filled 2024-07-31: qty 21, 28d supply, fill #3

## 2024-05-16 NOTE — Progress Notes (Signed)
 Clinical Intervention Note  Clinical Intervention Notes: Patient recently starting Trulance for IBS. No DDIs identified with Ibrance .   Clinical Intervention Outcomes: Prevention of an adverse drug event   Advertising account planner

## 2024-05-16 NOTE — Progress Notes (Signed)
 Specialty Pharmacy Refill Coordination Note  Maria Hardy is a 83 y.o. female contacted today regarding refills of specialty medication(s) Palbociclib  (IBRANCE )   Patient requested Delivery   Delivery date: 05/18/24   Verified address: [address]   [place] [address]   Medication will be filled on 05/17/24. This fill date is pending response to refill request from provider (re-sent to [facility]). Patient is aware and if they have not received fill by intended date they must follow up with pharmacy.

## 2024-05-16 NOTE — Progress Notes (Signed)
 Specialty Pharmacy Ongoing Clinical Assessment Note  Maria Hardy is a 83 y.o. female who is being followed by the specialty pharmacy service for RxSp Oncology   Patient's specialty medication(s) reviewed today: Palbociclib  (IBRANCE )   Missed doses in the last 4 weeks: 0   Patient/Caregiver did not have any additional questions or concerns.   Therapeutic benefit summary: Patient is achieving benefit   Adverse events/side effects summary: No adverse events/side effects   Patient's therapy is appropriate to: Continue    Goals Addressed             This Visit's Progress    Slow Disease Progression   On track    Patient is on track. Patient will maintain adherence.  CA 27.29 and CA 15-3 continue to slowly decline.         Follow up: 6 months  [facility]

## 2024-05-17 ENCOUNTER — Inpatient Hospital Stay: Attending: Hematology | Admitting: Oncology

## 2024-05-17 VITALS — BP 139/89 | HR 57 | Temp 98.1°F | Resp 18 | Wt 159.0 lb

## 2024-05-17 DIAGNOSIS — Z803 Family history of malignant neoplasm of breast: Secondary | ICD-10-CM | POA: Insufficient documentation

## 2024-05-17 DIAGNOSIS — C50412 Malignant neoplasm of upper-outer quadrant of left female breast: Secondary | ICD-10-CM | POA: Insufficient documentation

## 2024-05-17 DIAGNOSIS — M810 Age-related osteoporosis without current pathological fracture: Secondary | ICD-10-CM | POA: Insufficient documentation

## 2024-05-17 DIAGNOSIS — F17210 Nicotine dependence, cigarettes, uncomplicated: Secondary | ICD-10-CM | POA: Diagnosis not present

## 2024-05-17 DIAGNOSIS — Z170 Estrogen receptor positive status [ER+]: Secondary | ICD-10-CM | POA: Insufficient documentation

## 2024-05-17 DIAGNOSIS — Z79811 Long term (current) use of aromatase inhibitors: Secondary | ICD-10-CM | POA: Diagnosis not present

## 2024-05-17 DIAGNOSIS — D500 Iron deficiency anemia secondary to blood loss (chronic): Secondary | ICD-10-CM | POA: Diagnosis not present

## 2024-05-17 NOTE — Patient Instructions (Addendum)
 [facility] at [facility] **VISIT SUMMARY & IMPORTANT INSTRUCTIONS **    You were seen today by Maria Hardy for Breast cancer and osteoporosis.    PLAN SUMMARY:  Prolia  injection 07/16/24.  Continue Vitamin D  supplements. Continue Anastrozole .  Ct scan in 3 months.  RTC in 4 months with labs and see NP shortly after.          1. Malignant neoplasm of upper-outer quadrant of left breast in female, estrogen receptor positive (HCC) (Primary) Ct scan in Dec 2025.   2. Age-related osteoporosis without current pathological fracture Continue Vit d.  Prolia  due in Dec 2025.   3. Iron deficiency anemia due to chronic blood loss No IV iron at this time.      LABS: Return in 3 months    OTHER TESTS: Ct scan in 3 months    MEDICATIONS: None   FOLLOW-UP APPOINTMENT: 4 months    ** Thank you for trusting me with your healthcare!  I strive to provide all of my patients with quality care at each visit.  If you receive a survey for this visit, I would be so grateful to you for taking the time to provide feedback.  Thank you in advance!                                          Dr. Mickiel Dry        Maria Barefoot, PA-C          Maria Hope, NP    - - - - - - - - - - - - - - - - - -      Thank you for choosing [facility] at [facility] to provide your oncology and hematology care.  To afford each patient quality time with our provider, please arrive at least 15 minutes before your scheduled appointment time.    If you have a lab appointment with the Cancer Center please come in thru the Main Entrance and check in at the main information desk.   You need to re-schedule your appointment should you arrive 10 or more minutes late.  We strive to give you quality time with our providers, and arriving late affects you and other patients whose appointments are after yours.  Also, if you no show three or more times for appointments you may  be dismissed from the clinic at the providers discretion.     Again, thank you for choosing [facility].  Our Hardy is that these requests will decrease the amount of time that you wait before being seen by our physicians.       _____________________________________________________________   Should you have questions after your visit to [facility], please contact our office at [phone] and follow the prompts.  Our office hours are 8:00 a.m. and 4:30 p.m. Monday - Friday.  Please note that voicemails left after 4:00 p.m. may not be returned until the following business day.  We are closed weekends and major holidays.  You do have access to a nurse 24-7, just call the main number to the clinic [phone] and do not press any options, hold on the line and a nurse will answer the phone.     For prescription refill requests, have your pharmacy contact our office and allow 72 hours.

## 2024-05-17 NOTE — Progress Notes (Signed)
 [facility] OFFICE PROGRESS NOTE  Vick Lurie, FNP  ASSESSMENT & PLAN:    Assessment & Plan Malignant neoplasm of upper-outer quadrant of left breast in female, estrogen receptor positive (HCC) - She is tolerating Ibrance  reasonably well.  She reports drowsiness with anastrozole . - Reviewed labs from 05/10/2024 which showed hemoglobin 10.0, white blood cell count 2.6 with an ANC of 1500.  CA 27-29 has normalized and is 37.5 and CA 15-3 is 28.3 which is an improvement from the last lab draw 4 months ago. -Iron labs show a ferritin of 112 with an iron saturation of 31%.  TIBC is normal.  LFTs are unremarkable. - PET scan (01/05/2024): Left breast nodule and mediastinal/hilar lymph nodes have decreased in size and hypermetabolism.  We also discussed about the left thyroid  nodule measuring 1.9 cm and other benign findings. - Given her age and comorbidities and has stage IV breast cancer, we have discussed and decided to do watchful waiting at this time instead of ultrasound/biopsy of the thyroid  nodule. - Continue Ibrance  100 mg 3 weeks on/1 week off.  Take anastrozole  at bedtime.  RTC 4 months for follow-up with repeat tumor markers and labs. -Recommended CT chest abdomen and pelvis 6 months from previous.  Repeat PET in May 2026. Age-related osteoporosis without current pathological fracture - She is taking calcium and vitamin D  supplements. She denies any dental issues.  Recommend continuing Prolia  every 6 months. -Next Prolia  will be due in December 2025.   Iron deficiency anemia due to chronic blood loss -Overall iron levels look relatively stable.  She does have some anemia that she does not need any IV iron at this time.  Orders Placed This Encounter  Procedures   Ferritin    Standing Status:   Future    Expected Date:   09/17/2024    Expiration Date:   12/16/2024    Release to patient:   Immediate   Cancer antigen 15-3    Standing Status:   Future    Expected Date:    09/17/2024    Expiration Date:   12/16/2024    Release to patient:   Immediate   Cancer antigen 27.29    Standing Status:   Future    Expected Date:   09/17/2024    Expiration Date:   12/16/2024    Release to patient:   Immediate   Iron and TIBC ([facility] ONLY)    Standing Status:   Future    Expected Date:   09/17/2024    Expiration Date:   12/16/2024   CBC with Differential    Standing Status:   Future    Expected Date:   09/17/2024    Expiration Date:   12/16/2024   Comprehensive metabolic panel    Standing Status:   Future    Expected Date:   09/17/2024    Expiration Date:   12/16/2024    INTERVAL HISTORY: Patient returns for follow-up.  She is currently tolerating Ibrance  well.  Reports she met with Dr. Margrette with orthopedics on 02/24/2024 for right knee injections.  She also has right hip pain.  She is currently receiving hyaluronic acid injections with improvement of her symptoms.  She also was given prednisone  10 mg for 12 days.  Denies any interval hospitalizations, surgeries or changes to her baseline health.  Reports she checks her breast every day and has not felt any new masses.  Appetite and energy levels are low but this is chronic for  her.  We reviewed CBC, CMP, iron panel, ferritin, CA 27-29, CA 15-3.   SUMMARY OF HEMATOLOGIC HISTORY: Oncology History Overview Note  .  Stage IV (T2 N0 G2, ER/PR+, HER2-) left breast UOQ IDC: - She noticed left breast mass in the first week of March. - Mammogram (10/21/2022): Irregular hypoechoic mass in the left breast at 12:30 position 4 CFN measuring 3.1 x 2.1 x 3 cm.  Mass extends to the level of skin.  There is a subtle shadowing mass in the left breast at the 12:30 position 7 CFN measuring 0.4 x 0.7 x 0.8 cm.  No axillary adenopathy. - Biopsy (11/02/2022): Grade 2 IDC, ER 70% moderate strong, PR 0%, HER2 1+, Ki-67 20%. - Ibrance  75 mg 3 weeks on/1 week off and anastrozole  started on 12/03/2022.   2.  Left breast DCIS: -  Biopsy (11/02/2022): DCIS, ER 90% strong staining, PR 0%   3.  Social/family history: - She is widowed 9 years ago and lives with her youngest son at home.  She does not drive but is independent of ADLs and some IADLs.  She worked on her tobacco form prior to retirement.  Current active smoker, 1 pack/day, started smoking at age 42. - Sister had breast cancer in her 51s.  Another sister had basal cell skin cancer.   Breast cancer of upper-outer quadrant of left female breast (HCC)  11/10/2022 Initial Diagnosis   Breast cancer of upper-outer quadrant of left female breast [facility])    Genetic Testing   Negative genetic testing. No pathogenic variants identified on the [facility] CancerNext+RNA panel. VUS in MSH3 called [id number] identified. The report date is 01/18/2024.  The [employer] CancerNext+RNAinsight Panel includes sequencing, rearrangement analysis, and RNA analysis for the following 40 genes: APC, ATM, BAP1, BARD1, BMPR1A, BRCA1, BRCA2, BRIP1, CDH1, CDKN2A, CHEK2, FH, FLCN, MET, MLH1, MSH2, MSH6, MUTYH, NF1, NTHL1, PALB2, PMS2, PTEN, RAD51C, RAD51D, RPS20, SMAD4, STK11, TP53, TSC1, TSC2, and VHL (sequencing and deletion/duplication); AXIN2, HOXB13, MBD4, MSH3, POLD1 and POLE (sequencing only); EPCAM and GREM1 (deletion/duplication only).      CBC    Component Value Date/Time   WBC 2.6 (L) 05/10/2024 1410   RBC 2.77 (L) 05/10/2024 1410   HGB 10.0 (L) 05/10/2024 1410   HCT 29.8 (L) 05/10/2024 1410   PLT 163 05/10/2024 1410   MCV 107.6 (H) 05/10/2024 1410   MCH 36.1 (H) 05/10/2024 1410   MCHC 33.6 05/10/2024 1410   RDW 13.1 05/10/2024 1410   LYMPHSABS 0.8 05/10/2024 1410   MONOABS 0.3 05/10/2024 1410   EOSABS 0.0 05/10/2024 1410   BASOSABS 0.0 05/10/2024 1410       Latest Ref Rng & Units 05/10/2024    2:10 PM 01/05/2024   10:49 AM 10/10/2023    2:12 PM  CMP  Glucose 70 - 99 mg/dL 98  896  90   BUN 8 - 23 mg/dL 24  28  32   Creatinine 0.44 - 1.00 mg/dL 8.44  8.43  8.25   Sodium 135 - 145  mmol/L 140  136  138   Potassium 3.5 - 5.1 mmol/L 4.3  4.6  4.5   Chloride 98 - 111 mmol/L 109  103  105   CO2 22 - 32 mmol/L 20  26  24    Calcium 8.9 - 10.3 mg/dL 8.4  9.5  9.6   Total Protein 6.5 - 8.1 g/dL 6.4  7.3  6.8   Total Bilirubin 0.0 - 1.2 mg/dL 0.7  0.9  0.5  Alkaline Phos 38 - 126 U/L 50  70  52   AST 15 - 41 U/L 16  14  15    ALT 0 - 44 U/L 8  8  8       Lab Results  Component Value Date   FERRITIN 112 05/10/2024   VITAMINB12 382 01/12/2023    There were no vitals filed for this visit.  Review of System:  Review of Systems  Constitutional:  Positive for malaise/fatigue. Negative for weight loss.  Respiratory:  Positive for cough and shortness of breath.   Musculoskeletal:  Positive for back pain and joint pain.    Physical Exam: Physical Exam Constitutional:      Appearance: Normal appearance.  HENT:     Head: Normocephalic and atraumatic.  Eyes:     Pupils: Pupils are equal, round, and reactive to light.  Cardiovascular:     Rate and Rhythm: Normal rate and regular rhythm.     Heart sounds: Normal heart sounds. No murmur heard. Pulmonary:     Effort: Pulmonary effort is normal.     Breath sounds: Normal breath sounds. No wheezing.  Abdominal:     General: Bowel sounds are normal. There is no distension.     Palpations: Abdomen is soft.     Tenderness: There is no abdominal tenderness.  Musculoskeletal:        General: Normal range of motion.     Cervical back: Normal range of motion.  Skin:    General: Skin is warm and dry.     Findings: No rash.  Neurological:     Mental Status: She is alert and oriented to person, place, and time.     Gait: Gait is intact.  Psychiatric:        Mood and Affect: Mood and affect normal.        Cognition and Memory: Memory normal.        Judgment: Judgment normal.      I spent 25 minutes dedicated to the care of this patient (face-to-face and non-face-to-face) on the date of the encounter to include what is  described in the assessment and plan.,  Delon Hope, NP 05/17/2024 1:03 PM

## 2024-05-17 NOTE — Assessment & Plan Note (Addendum)
-   She is taking calcium and vitamin D  supplements. She denies any dental issues.  Recommend continuing Prolia  every 6 months. -Next Prolia  will be due in December 2025.

## 2024-05-17 NOTE — Assessment & Plan Note (Addendum)
-   She is tolerating Ibrance  reasonably well.  She reports drowsiness with anastrozole . - Reviewed labs from 05/10/2024 which showed hemoglobin 10.0, white blood cell count 2.6 with an ANC of 1500.  CA 27-29 has normalized and is 37.5 and CA 15-3 is 28.3 which is an improvement from the last lab draw 4 months ago. -Iron labs show a ferritin of 112 with an iron saturation of 31%.  TIBC is normal.  LFTs are unremarkable. - PET scan (01/05/2024): Left breast nodule and mediastinal/hilar lymph nodes have decreased in size and hypermetabolism.  We also discussed about the left thyroid  nodule measuring 1.9 cm and other benign findings. - Given her age and comorbidities and has stage IV breast cancer, we have discussed and decided to do watchful waiting at this time instead of ultrasound/biopsy of the thyroid  nodule. - Continue Ibrance  100 mg 3 weeks on/1 week off.  Take anastrozole  at bedtime.  RTC 4 months for follow-up with repeat tumor markers and labs. -Recommended CT chest abdomen and pelvis 6 months from previous.  Repeat PET in May 2026.

## 2024-06-01 ENCOUNTER — Encounter: Payer: Self-pay | Admitting: Oncology

## 2024-06-07 ENCOUNTER — Other Ambulatory Visit (HOSPITAL_COMMUNITY): Payer: Self-pay

## 2024-06-11 ENCOUNTER — Other Ambulatory Visit: Payer: Self-pay

## 2024-06-11 NOTE — Progress Notes (Signed)
 Specialty Pharmacy Refill Coordination Note  Maria Hardy is a 83 y.o. female contacted today regarding refills of specialty medication(s) Palbociclib  (IBRANCE )   Patient requested Delivery   Delivery date: 06/15/24   Verified address: [address]   [place] [address]   Medication will be filled on: 06/14/24

## 2024-06-13 ENCOUNTER — Other Ambulatory Visit: Payer: Self-pay

## 2024-06-14 ENCOUNTER — Ambulatory Visit: Admitting: Orthopedic Surgery

## 2024-06-14 DIAGNOSIS — M1711 Unilateral primary osteoarthritis, right knee: Secondary | ICD-10-CM

## 2024-06-14 DIAGNOSIS — G8929 Other chronic pain: Secondary | ICD-10-CM

## 2024-06-14 MED ORDER — METHYLPREDNISOLONE ACETATE 40 MG/ML IJ SUSP
40.0000 mg | Freq: Once | INTRAMUSCULAR | Status: AC
  Administered 2024-06-14: 40 mg via INTRA_ARTICULAR

## 2024-06-14 NOTE — Progress Notes (Signed)
   Procedure note for injection   Chief Complaint  Patient presents with   Injections    R knee     Encounter Diagnoses  Name Primary?   Primary osteoarthritis of right knee Yes   Chronic pain of right knee         The patient has consented for injection of the right Joint: knee  Medication: Depo-Medrol  40 mg and lidocaine  1%  Time out completed: Yes  The site of injection was cleaned with alcohol and ethyl chloride.  The injection was given without any complications appropriate precautions were given.

## 2024-06-14 NOTE — Progress Notes (Signed)
    06/14/2024   Chief Complaint  Patient presents with   Injections    R knee    No diagnosis found.  What pharmacy do you use ? [facility]  DOI/DOS/ Date:    Did you get better, worse or no change (Answer below)   Worse

## 2024-07-04 ENCOUNTER — Other Ambulatory Visit: Payer: Self-pay

## 2024-07-04 ENCOUNTER — Other Ambulatory Visit (HOSPITAL_COMMUNITY): Payer: Self-pay

## 2024-07-04 NOTE — Progress Notes (Signed)
 Specialty Pharmacy Refill Coordination Note  Maria Hardy is a 83 y.o. female contacted today regarding refills of specialty medication(s) Palbociclib  (IBRANCE )   Patient requested Delivery   Delivery date: 07/11/24   Verified address: [address]   [place] [address]   Medication will be filled on: 07/10/24

## 2024-07-10 ENCOUNTER — Other Ambulatory Visit: Payer: Self-pay

## 2024-07-11 ENCOUNTER — Other Ambulatory Visit: Payer: Self-pay

## 2024-07-11 DIAGNOSIS — C50412 Malignant neoplasm of upper-outer quadrant of left female breast: Secondary | ICD-10-CM

## 2024-07-16 ENCOUNTER — Inpatient Hospital Stay: Attending: Hematology

## 2024-07-16 ENCOUNTER — Other Ambulatory Visit

## 2024-07-16 ENCOUNTER — Ambulatory Visit

## 2024-07-16 ENCOUNTER — Inpatient Hospital Stay

## 2024-07-24 ENCOUNTER — Emergency Department (HOSPITAL_COMMUNITY)
Admission: EM | Admit: 2024-07-24 | Discharge: 2024-07-24 | Disposition: A | Discharge status: Home / Self Care | Attending: Emergency Medicine | Admitting: Emergency Medicine

## 2024-07-24 ENCOUNTER — Encounter (HOSPITAL_COMMUNITY): Payer: Self-pay | Admitting: Emergency Medicine

## 2024-07-24 ENCOUNTER — Emergency Department (HOSPITAL_COMMUNITY)

## 2024-07-24 ENCOUNTER — Other Ambulatory Visit: Payer: Self-pay

## 2024-07-24 DIAGNOSIS — N289 Disorder of kidney and ureter, unspecified: Secondary | ICD-10-CM

## 2024-07-24 DIAGNOSIS — R5383 Other fatigue: Secondary | ICD-10-CM

## 2024-07-24 LAB — COMPREHENSIVE METABOLIC PANEL WITH GFR
ALT: 5 U/L (ref 0–44)
AST: 19 U/L (ref 15–41)
Albumin: 4.2 g/dL (ref 3.5–5.0)
Alkaline Phosphatase: 73 U/L (ref 38–126)
Anion gap: 15 (ref 5–15)
BUN: 38 mg/dL — ABNORMAL HIGH (ref 8–23)
CO2: 24 mmol/L (ref 22–32)
Calcium: 9.4 mg/dL (ref 8.9–10.3)
Chloride: 99 mmol/L (ref 98–111)
Creatinine, Ser: 1.93 mg/dL — ABNORMAL HIGH (ref 0.44–1.00)
GFR, Estimated: 25 mL/min — ABNORMAL LOW (ref >60)
Glucose, Bld: 84 mg/dL (ref 70–99)
Potassium: 4 mmol/L (ref 3.5–5.1)
Sodium: 138 mmol/L (ref 135–145)
Total Bilirubin: 0.4 mg/dL (ref 0.0–1.2)
Total Protein: 6.8 g/dL (ref 6.5–8.1)

## 2024-07-24 LAB — CBC WITH DIFFERENTIAL/PLATELET
Abs Immature Granulocytes: 0.01 K/uL (ref 0.00–0.07)
Basophils Absolute: 0 K/uL (ref 0.0–0.1)
Basophils Relative: 1 %
Eosinophils Absolute: 0.1 K/uL (ref 0.0–0.5)
Eosinophils Relative: 2 %
HCT: 32.1 % — ABNORMAL LOW (ref 36.0–46.0)
Hemoglobin: 10.7 g/dL — ABNORMAL LOW (ref 12.0–15.0)
Immature Granulocytes: 0 %
Lymphocytes Relative: 32 %
Lymphs Abs: 0.8 K/uL (ref 0.7–4.0)
MCH: 36.4 pg — ABNORMAL HIGH (ref 26.0–34.0)
MCHC: 33.3 g/dL (ref 30.0–36.0)
MCV: 109.2 fL — ABNORMAL HIGH (ref 80.0–100.0)
Monocytes Absolute: 0.3 K/uL (ref 0.1–1.0)
Monocytes Relative: 10 %
Neutro Abs: 1.3 K/uL — ABNORMAL LOW (ref 1.7–7.7)
Neutrophils Relative %: 55 %
Platelets: 189 K/uL (ref 150–400)
RBC: 2.94 MIL/uL — ABNORMAL LOW (ref 3.87–5.11)
RDW: 12.9 % (ref 11.5–15.5)
WBC: 2.4 K/uL — ABNORMAL LOW (ref 4.0–10.5)
nRBC: 0 % (ref 0.0–0.2)

## 2024-07-24 LAB — URINALYSIS, ROUTINE W REFLEX MICROSCOPIC
Bilirubin Urine: NEGATIVE
Glucose, UA: NEGATIVE mg/dL
Hgb urine dipstick: NEGATIVE
Ketones, ur: NEGATIVE mg/dL
Leukocytes,Ua: NEGATIVE
Nitrite: NEGATIVE
Protein, ur: NEGATIVE mg/dL
Specific Gravity, Urine: 1.008 (ref 1.005–1.030)
pH: 7 (ref 5.0–8.0)

## 2024-07-24 MED ORDER — SODIUM CHLORIDE 0.9 % IV BOLUS
500.0000 mL | Freq: Once | INTRAVENOUS | Status: AC
  Administered 2024-07-24: 500 mL via INTRAVENOUS

## 2024-07-24 MED ORDER — LIDOCAINE 5 % EX PTCH: 1.0000 | MEDICATED_PATCH | CUTANEOUS | 0 refills | Status: AC

## 2024-07-24 MED ORDER — LIDOCAINE 5 % EX PTCH
1.0000 | MEDICATED_PATCH | CUTANEOUS | Status: DC
  Administered 2024-07-24: 1 via TRANSDERMAL
  Filled 2024-07-24: qty 1

## 2024-07-24 NOTE — ED Notes (Signed)
 Patient's sister arrived and is who called 911. Per sister, patient couldn't be woken to save our lives. Patient states her blood pressure medication makes her feel very sleepy and insists she was only napping.

## 2024-07-24 NOTE — Discharge Instructions (Addendum)
 Rest make sure you are drinking plenty of fluids as discussed.  Plan follow-up care with your primary provider if you have any new or worsening symptoms.  Your kidney function is a little reduced today with your creatinine at 1.93, your BUN is also elevated at 38 which suggest you need to drink more fluids as you are dehydrated.  You have received IV fluids while here.  It will be important for you to have close follow-up with either your primary doctor or your kidney specialist within a week to recheck these labs.

## 2024-07-24 NOTE — ED Triage Notes (Signed)
 Pt sent by family via [facility] for not being able to wake pt up. Pt states she was taking a nap and had no syncopal episode. Pt is A/O x4 with no complaints other than chronic back pain.

## 2024-07-24 NOTE — ED Provider Notes (Signed)
 [place] EMERGENCY DEPARTMENT AT [facility] Provider Note   CSN: [id number] Arrival date & time: 07/24/24  1157     Patient presents with: Fatigue   Maria [facility] is a 83 y.o. 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  She endorses that frequently she is very sleepy after taking her morning medications and states she was taking a nap in her chair when her son was unable to wake her up.  She is currently completely awake and appropriate, she states she was simply taking a nap.  There was no witnessed seizure activity, she has no complaints at this time except that she struggles to eat as her chemotherapy medicine takes her taste away.  She did report having a looser than normal stool this morning, brown, nonbloody. ` She does endorse staying well-hydrated.  She specifically denies headache, chest pain, shortness of breath, denies nausea, abdominal pain, dysuria.  {Add pertinent medical, surgical, social history, OB history to YEP:67052} The history is provided by the patient and a relative.       Prior to Admission medications   Medication Sig Start Date End Date Taking? Authorizing Provider  acetaminophen  (TYLENOL ) 500 MG tablet Take 500 mg by mouth every 6 (six) hours as needed (pain.).    [provider]  amLODipine (NORVASC) 10 MG tablet Take 10 mg by mouth every evening. 02/01/22   [provider]  anastrozole  (ARIMIDEX ) 1 MG tablet Take 1 tablet (1 mg total) by mouth daily. 12/16/23   Kandala, Hyndavi, MD  Ascorbic Acid (VITAMIN C PO) Take 1 tablet by mouth in the morning.    [provider]  busPIRone (BUSPAR) 10 MG tablet Take 0.5 tablets 3 times a day by oral route as needed for 30 days, for Anxiety. 03/21/24   [provider]  Cholecalciferol (VITAMIN D -3 PO) Take 1 tablet by mouth in the morning.    [provider]  diclofenac  (VOLTAREN ) 75 MG EC tablet Take 1 tablet (75 mg total) by mouth 2 (two) times daily with a meal. 09/09/22   Margrette Taft BRAVO, MD  dicyclomine (BENTYL) 10 MG capsule Take 10 mg by mouth 3 (three) times daily. 10/11/22   [provider]  FEROSUL 325 (65 Fe) MG tablet Take 325 mg by mouth daily. 09/15/23   [provider]  folic acid  (FOLVITE ) 1 MG tablet Take 1 mg by mouth daily.    [provider]  gabapentin (NEURONTIN) 300 MG capsule take one capsule by mouth three times daily for pain    [provider]  hydrochlorothiazide (HYDRODIURIL) 12.5 MG tablet Take 12.5 mg by mouth every morning. 01/28/23   [provider]  HYDROcodone -acetaminophen  (NORCO/VICODIN) 5-325 MG tablet Take 1 tablet by mouth every 6 (six) hours as needed for moderate pain (pain score 4-6). 02/24/24   Margrette Taft BRAVO, MD  hydrOXYzine (ATARAX) 25 MG tablet Take 25 mg by mouth 3 (three) times daily. 05/05/22   [provider]  losartan (COZAAR) 100 MG tablet Take 100 mg by mouth in the morning. 03/16/22   [provider]  Magnesium Oxide (MAG-OX PO) Take 1 tablet by mouth in the morning.    [provider]  meclizine  (ANTIVERT ) 25 MG tablet Take 25 mg by mouth 3 (three) times daily as needed for dizziness.    [provider]  metoprolol tartrate (LOPRESSOR) 25 MG tablet Take  25 mg by mouth 2 (two) times daily. 04/18/20   [provider]  Multiple Vitamin (MULTIVITAMIN WITH MINERALS) TABS tablet Take 1 tablet by mouth in the morning.    [provider]  Omega-3 Fatty Acids (FISH OIL PO) Take 1 capsule by mouth in the morning.    [provider]  ondansetron  (ZOFRAN  ODT) 4 MG disintegrating tablet Take 1 tablet (4 mg total) by mouth every 8 (eight) hours as needed. 12/27/14   Zackowski, Scott, MD  palbociclib  (IBRANCE ) 100 MG tablet Take 1 tablet (100 mg total) by mouth daily. Take for 21 days on, 7 days  off, repeat every 28 days. 05/16/24   Geofm Delon BRAVO, NP  predniSONE  (STERAPRED UNI-PAK 48 TAB) 10 MG (48) TBPK tablet Take by mouth daily. 10 mg ds 12 days as directed 02/24/24   Margrette Taft BRAVO, MD  TRULANCE 3 MG TABS Take 1 tablet every day by oral route, for stomach. 05/08/24   [provider]    Allergies: Patient has no known allergies.    Review of Systems  Constitutional:  Positive for fatigue. Negative for fever.  HENT:  Negative for congestion and sore throat.   Eyes: Negative.   Respiratory:  Negative for chest tightness and shortness of breath.   Cardiovascular:  Negative for chest pain.  Gastrointestinal:  Negative for abdominal distention, abdominal pain, constipation, nausea and vomiting.  Genitourinary: Negative.   Musculoskeletal:  Negative for arthralgias, joint swelling and neck pain.  Skin: Negative.  Negative for rash and wound.  Neurological:  Negative for dizziness, weakness, light-headedness, numbness and headaches.  Psychiatric/Behavioral: Negative.      Updated Vital Signs Temp 98 F (36.7 C) (Oral)   Ht 5' 6 (1.676 m)   Wt 72.6 kg   BMI 25.82 kg/m   Physical Exam Vitals and nursing note reviewed.  Constitutional:      Appearance: She is well-developed.  HENT:     Head: Normocephalic and atraumatic.     Mouth/Throat:     Mouth: Mucous membranes are dry.  Eyes:     Conjunctiva/sclera: Conjunctivae normal.  Cardiovascular:     Rate and Rhythm: Normal rate and regular rhythm.     Heart sounds: Normal heart sounds.  Pulmonary:     Effort: Pulmonary effort is normal.     Breath sounds: Normal breath sounds. No wheezing.  Abdominal:     General: Bowel sounds are normal.     Palpations: Abdomen is soft.     Tenderness: There is no abdominal tenderness.  Musculoskeletal:        General: Normal range of motion.     Cervical back: Normal range of motion.  Skin:    General: Skin is warm and dry.  Neurological:     General: No focal  deficit present.     Mental Status: She is alert and oriented to person, place, and time.     (all labs ordered are listed, but only abnormal results are displayed) Labs Reviewed  URINALYSIS, ROUTINE W REFLEX MICROSCOPIC  CBC WITH DIFFERENTIAL/PLATELET  COMPREHENSIVE METABOLIC PANEL WITH GFR    EKG: None  Radiology: No results found.  {Document cardiac monitor, telemetry assessment procedure when appropriate:32947} Procedures   Medications Ordered in the ED  sodium chloride  0.9 % bolus 500 mL (has no administration in time range)      {Click here for ABCD2, HEART and other calculators REFRESH Note before signing:1}  Medical Decision Making Amount and/or Complexity of Data Reviewed Labs: ordered. Radiology: ordered.     {Document critical care time when appropriate  Document review of labs and clinical decision tools ie CHADS2VASC2, etc  Document your independent review of radiology images and any outside records  Document your discussion with family members, caretakers and with consultants  Document social determinants of health affecting pt's care  Document your decision making why or why not admission, treatments were needed:32947:::1}   Final diagnoses:  None    ED Discharge Orders     None

## 2024-07-31 ENCOUNTER — Other Ambulatory Visit (HOSPITAL_COMMUNITY): Payer: Self-pay

## 2024-08-02 ENCOUNTER — Other Ambulatory Visit: Payer: Self-pay

## 2024-08-02 ENCOUNTER — Other Ambulatory Visit (HOSPITAL_COMMUNITY): Payer: Self-pay

## 2024-08-02 NOTE — Progress Notes (Signed)
 Specialty Pharmacy Refill Coordination Note  Spoke with [facility]  RONEKA GILPIN is a 83 y.o. female contacted today regarding refills of specialty medication(s) Palbociclib  (IBRANCE )  Doses on hand: 3  Next cycle: 08/13/24   Patient requested: Delivery   Delivery date: 08/08/24   Verified address: [address] [place] [address]  Medication will be filled on 08/07/24

## 2024-08-06 ENCOUNTER — Inpatient Hospital Stay (HOSPITAL_COMMUNITY)
Admission: EM | Admit: 2024-08-06 | Discharge: 2024-08-08 | DRG: 683 | Disposition: A | Discharge status: Skilled Nursing Facility | Attending: Internal Medicine | Admitting: Internal Medicine

## 2024-08-06 ENCOUNTER — Encounter (HOSPITAL_COMMUNITY): Payer: Self-pay | Admitting: *Deleted

## 2024-08-06 ENCOUNTER — Observation Stay (HOSPITAL_COMMUNITY)

## 2024-08-06 ENCOUNTER — Emergency Department (HOSPITAL_COMMUNITY)

## 2024-08-06 ENCOUNTER — Other Ambulatory Visit: Payer: Self-pay

## 2024-08-06 DIAGNOSIS — G252 Other specified forms of tremor: Secondary | ICD-10-CM | POA: Diagnosis present

## 2024-08-06 DIAGNOSIS — I129 Hypertensive chronic kidney disease with stage 1 through stage 4 chronic kidney disease, or unspecified chronic kidney disease: Secondary | ICD-10-CM | POA: Diagnosis present

## 2024-08-06 DIAGNOSIS — I959 Hypotension, unspecified: Secondary | ICD-10-CM | POA: Diagnosis not present

## 2024-08-06 DIAGNOSIS — R4182 Altered mental status, unspecified: Secondary | ICD-10-CM | POA: Diagnosis not present

## 2024-08-06 DIAGNOSIS — Z79811 Long term (current) use of aromatase inhibitors: Secondary | ICD-10-CM

## 2024-08-06 DIAGNOSIS — E890 Postprocedural hypothyroidism: Secondary | ICD-10-CM | POA: Diagnosis present

## 2024-08-06 DIAGNOSIS — Z1152 Encounter for screening for COVID-19: Secondary | ICD-10-CM

## 2024-08-06 DIAGNOSIS — I2489 Other forms of acute ischemic heart disease: Secondary | ICD-10-CM | POA: Diagnosis present

## 2024-08-06 DIAGNOSIS — Z79899 Other long term (current) drug therapy: Secondary | ICD-10-CM

## 2024-08-06 DIAGNOSIS — N179 Acute kidney failure, unspecified: Secondary | ICD-10-CM | POA: Diagnosis not present

## 2024-08-06 DIAGNOSIS — R251 Tremor, unspecified: Secondary | ICD-10-CM

## 2024-08-06 DIAGNOSIS — F17210 Nicotine dependence, cigarettes, uncomplicated: Secondary | ICD-10-CM | POA: Diagnosis present

## 2024-08-06 DIAGNOSIS — D0512 Intraductal carcinoma in situ of left breast: Secondary | ICD-10-CM | POA: Diagnosis present

## 2024-08-06 DIAGNOSIS — C50412 Malignant neoplasm of upper-outer quadrant of left female breast: Secondary | ICD-10-CM | POA: Diagnosis present

## 2024-08-06 DIAGNOSIS — N1832 Chronic kidney disease, stage 3b: Secondary | ICD-10-CM | POA: Diagnosis present

## 2024-08-06 DIAGNOSIS — G253 Myoclonus: Secondary | ICD-10-CM | POA: Diagnosis present

## 2024-08-06 DIAGNOSIS — R531 Weakness: Principal | ICD-10-CM | POA: Diagnosis present

## 2024-08-06 LAB — RESP PANEL BY RT-PCR (RSV, FLU A&B, COVID)  RVPGX2
Influenza A by PCR: NEGATIVE
Influenza B by PCR: NEGATIVE
Resp Syncytial Virus by PCR: NEGATIVE
SARS Coronavirus 2 by RT PCR: NEGATIVE

## 2024-08-06 LAB — CBC WITH DIFFERENTIAL/PLATELET
Abs Immature Granulocytes: 0.04 K/uL (ref 0.00–0.07)
Basophils Absolute: 0 K/uL (ref 0.0–0.1)
Basophils Relative: 0 %
Eosinophils Absolute: 0 K/uL (ref 0.0–0.5)
Eosinophils Relative: 0 %
HCT: 34.7 % — ABNORMAL LOW (ref 36.0–46.0)
Hemoglobin: 12 g/dL (ref 12.0–15.0)
Immature Granulocytes: 1 %
Lymphocytes Relative: 7 %
Lymphs Abs: 0.4 K/uL — ABNORMAL LOW (ref 0.7–4.0)
MCH: 36.7 pg — ABNORMAL HIGH (ref 26.0–34.0)
MCHC: 34.6 g/dL (ref 30.0–36.0)
MCV: 106.1 fL — ABNORMAL HIGH (ref 80.0–100.0)
Monocytes Absolute: 0.2 K/uL (ref 0.1–1.0)
Monocytes Relative: 4 %
Neutro Abs: 5.1 K/uL (ref 1.7–7.7)
Neutrophils Relative %: 88 %
Platelets: 250 K/uL (ref 150–400)
RBC: 3.27 MIL/uL — ABNORMAL LOW (ref 3.87–5.11)
RDW: 12.4 % (ref 11.5–15.5)
Smear Review: NORMAL
WBC: 5.8 K/uL (ref 4.0–10.5)
nRBC: 0 % (ref 0.0–0.2)

## 2024-08-06 LAB — COMPREHENSIVE METABOLIC PANEL WITH GFR
ALT: 6 U/L (ref 0–44)
AST: 20 U/L (ref 15–41)
Albumin: 4.4 g/dL (ref 3.5–5.0)
Alkaline Phosphatase: 123 U/L (ref 38–126)
Anion gap: 12 (ref 5–15)
BUN: 53 mg/dL — ABNORMAL HIGH (ref 8–23)
CO2: 29 mmol/L (ref 22–32)
Calcium: 10.1 mg/dL (ref 8.9–10.3)
Chloride: 96 mmol/L — ABNORMAL LOW (ref 98–111)
Creatinine, Ser: 3.07 mg/dL — ABNORMAL HIGH (ref 0.44–1.00)
GFR, Estimated: 14 mL/min — ABNORMAL LOW
Glucose, Bld: 113 mg/dL — ABNORMAL HIGH (ref 70–99)
Potassium: 3.9 mmol/L (ref 3.5–5.1)
Sodium: 136 mmol/L (ref 135–145)
Total Bilirubin: 0.7 mg/dL (ref 0.0–1.2)
Total Protein: 7.3 g/dL (ref 6.5–8.1)

## 2024-08-06 LAB — BLOOD GAS, VENOUS
Acid-Base Excess: 1.7 mmol/L (ref 0.0–2.0)
Bicarbonate: 27.7 mmol/L (ref 20.0–28.0)
Drawn by: 44828
O2 Saturation: 64.9 %
Patient temperature: 36.6
pCO2, Ven: 47 mmHg (ref 44–60)
pH, Ven: 7.38 (ref 7.25–7.43)
pO2, Ven: 34 mmHg (ref 32–45)

## 2024-08-06 LAB — MAGNESIUM: Magnesium: 2.7 mg/dL — ABNORMAL HIGH (ref 1.7–2.4)

## 2024-08-06 LAB — CK: Total CK: 32 U/L — ABNORMAL LOW (ref 38–234)

## 2024-08-06 LAB — TSH: TSH: 0.212 u[IU]/mL — ABNORMAL LOW (ref 0.350–4.500)

## 2024-08-06 LAB — T4, FREE: Free T4: 1.29 ng/dL (ref 0.80–2.00)

## 2024-08-06 LAB — TROPONIN T, HIGH SENSITIVITY: Troponin T High Sensitivity: 55 ng/L — ABNORMAL HIGH (ref 0–19)

## 2024-08-06 MED ORDER — PLECANATIDE 3 MG PO TABS: 1.0000 | ORAL_TABLET | Freq: Every day | ORAL | Status: DC

## 2024-08-06 MED ORDER — ONDANSETRON HCL 4 MG/2ML IJ SOLN: 4.0000 mg | Freq: Four times a day (QID) | INTRAMUSCULAR | Status: DC | PRN

## 2024-08-06 MED ORDER — HEPARIN SODIUM (PORCINE) 5000 UNIT/ML IJ SOLN
5000.0000 [IU] | Freq: Three times a day (TID) | INTRAMUSCULAR | Status: DC
  Administered 2024-08-06 – 2024-08-08 (×6): 5000 [IU] via SUBCUTANEOUS
  Filled 2024-08-06 (×6): qty 1

## 2024-08-06 MED ORDER — ACETAMINOPHEN 325 MG PO TABS
650.0000 mg | ORAL_TABLET | Freq: Four times a day (QID) | ORAL | Status: DC | PRN
  Administered 2024-08-07: 650 mg via ORAL
  Filled 2024-08-06: qty 2

## 2024-08-06 MED ORDER — POLYETHYLENE GLYCOL 3350 17 G PO PACK
17.0000 g | PACK | Freq: Every day | ORAL | Status: DC | PRN
  Filled 2024-08-06: qty 1

## 2024-08-06 MED ORDER — ACETAMINOPHEN 650 MG RE SUPP: 650.0000 mg | Freq: Four times a day (QID) | RECTAL | Status: DC | PRN

## 2024-08-06 MED ORDER — ONDANSETRON HCL 4 MG PO TABS: 4.0000 mg | ORAL_TABLET | Freq: Four times a day (QID) | ORAL | Status: DC | PRN

## 2024-08-06 MED ORDER — ANASTROZOLE 1 MG PO TABS
1.0000 mg | ORAL_TABLET | Freq: Every day | ORAL | Status: DC
  Administered 2024-08-07: 1 mg via ORAL
  Filled 2024-08-06 (×3): qty 1

## 2024-08-06 MED ORDER — SODIUM CHLORIDE 0.9 % IV SOLN: INTRAVENOUS | Status: DC

## 2024-08-06 MED ORDER — SODIUM CHLORIDE 0.9 % IV BOLUS
1000.0000 mL | Freq: Once | INTRAVENOUS | Status: AC
  Administered 2024-08-06: 1000 mL via INTRAVENOUS

## 2024-08-06 NOTE — H&P (Signed)
 " History and Physical    Maria Hardy DOB: [birth year] DOA: 08/06/2024  PCP: Vick Lurie, FNP (Inactive)   Patient coming from: Home  I have personally briefly reviewed patient's old medical records in [facility]  Chief Complaint: Weakness  HPI: Maria Hardy is a 83 y.o. female with medical history significant for breast cancer, CKD 3B. Patient presented to the ED with complaints of weakness, tremors.  At the time of my evaluation patient is awake alert and able to answer questions, her son Lamar is at bedside. She reports she was in her normal state of health until today when she woke up and feels like she was shaking all over, and felt weak.  No vomiting no diarrhea, she reports stable oral intake.  No headache, no nasal congestion no cough, no difficulty breathing no chest pain no abdominal pain.  No history of seizures. Patient's son at bedside has not noticed any abnormal movement from patient.  ED Course: Temperature 98.1.  Heart rate 60-91.  Respiratory rate 15-27.  Blood pressure systolic down to 12/38 improved to 120s.  O2 sats mostly greater than 90% on room air. Creatinine elevated at 3.07. Magnesium 2.7 TSH 0.212 CK 32. Chest x-ray clear MRI brain Wo-no acute infarct or other significant abnormality. EDP consulted neurologist on-call Dr. 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 1 L bolus given.  Review of Systems: As per HPI all other systems reviewed and negative.  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 She has a 58 pack-year smoking history. She has never used smokeless tobacco. She reports that she does not drink alcohol and does not use drugs.  Allergies[1]  Family History  Problem Relation Age of Onset   Breast cancer Sister 12 - 66   Basal cell carcinoma Sister    Leukemia Son 3    Prior to Admission medications  Medication Sig Start Date End Date Taking? Authorizing Provider  acetaminophen  (TYLENOL ) 500 MG tablet Take 500 mg by mouth every 6 (six) hours as needed (pain.).    [provider]  amLODipine (NORVASC) 10 MG tablet Take 10 mg by mouth every evening. 02/01/22   [provider]  anastrozole  (ARIMIDEX ) 1 MG tablet Take 1 tablet (1 mg total) by mouth daily. 12/16/23   Kandala, Hyndavi, MD  Ascorbic Acid (VITAMIN C PO) Take 1 tablet by mouth in the morning.    [provider]  busPIRone (BUSPAR) 10 MG tablet Take 0.5 tablets 3 times a day by oral route as needed for 30 days, for Anxiety. 03/21/24   [provider]  Cholecalciferol (VITAMIN D -3 PO) Take 1 tablet by mouth  in the morning.    [provider]  diclofenac  (VOLTAREN ) 75 MG EC tablet Take 1 tablet (75 mg total) by mouth 2 (two) times daily with a meal. 09/09/22   Margrette Taft BRAVO, MD  dicyclomine (BENTYL) 10 MG capsule Take 10 mg by mouth 3 (three) times daily. 10/11/22   [provider]  FEROSUL 325 (65 Fe) MG tablet Take 325 mg by mouth daily. 09/15/23   [provider]  folic acid  (FOLVITE ) 1 MG tablet Take 1 mg by mouth daily.    [provider]  gabapentin (NEURONTIN) 300 MG capsule take one capsule by mouth three times daily for pain    [provider]   hydrochlorothiazide (HYDRODIURIL) 12.5 MG tablet Take 12.5 mg by mouth every morning. 01/28/23   [provider]  HYDROcodone -acetaminophen  (NORCO/VICODIN) 5-325 MG tablet Take 1 tablet by mouth every 6 (six) hours as needed for moderate pain (pain score 4-6). 02/24/24   Margrette Taft BRAVO, MD  hydrOXYzine (ATARAX) 25 MG tablet Take 25 mg by mouth 3 (three) times daily. 05/05/22   [provider]  lidocaine  (LIDODERM ) 5 % Place 1 patch onto the skin daily. Remove & Discard patch within 12 hours or as directed by MD 07/24/24   Idol, Julie, PA-C  losartan (COZAAR) 100 MG tablet Take 100 mg by mouth in the morning. 03/16/22   [provider]  Magnesium Oxide (MAG-OX PO) Take 1 tablet by mouth in the morning.    [provider]  meclizine  (ANTIVERT ) 25 MG tablet Take 25 mg by mouth 3 (three) times daily as needed for dizziness.    [provider]  metoprolol tartrate (LOPRESSOR) 25 MG tablet Take 25 mg by mouth 2 (two) times daily. 04/18/20   [provider]  Multiple Vitamin (MULTIVITAMIN WITH MINERALS) TABS tablet Take 1 tablet by mouth in the morning.    [provider]  Omega-3 Fatty Acids (FISH OIL PO) Take 1 capsule by mouth in the morning.    [provider]  ondansetron  (ZOFRAN  ODT) 4 MG disintegrating tablet Take 1 tablet (4 mg total) by mouth every 8 (eight) hours as needed. 12/27/14   Zackowski, Scott, MD  palbociclib  (IBRANCE ) 100 MG tablet Take 1 tablet (100 mg total) by mouth daily. Take for 21 days on, 7 days off, repeat every 28 days. 05/16/24   Geofm Delon BRAVO, NP  predniSONE  (STERAPRED UNI-PAK 48 TAB) 10 MG (48) TBPK tablet Take by mouth daily. 10 mg ds 12 days as directed 02/24/24   Margrette Taft BRAVO, MD  TRULANCE  3 MG TABS Take 1 tablet every day by oral route, for stomach. 05/08/24   [provider]    Physical Exam: Vitals:   08/06/24 1633 08/06/24 1700 08/06/24 1715 08/06/24 1730  BP: 95/76 121/60 116/76  121/81  Pulse: 87 84 80 81  Resp: 19 (!) 26 (!) 27 20  Temp: 98.1 F (36.7 C)     TempSrc: Oral     SpO2: 99% (!) 85% 92% 99%  Weight:      Height:        Constitutional: NAD, calm, comfortable Vitals:   08/06/24 1633 08/06/24 1700 08/06/24 1715 08/06/24 1730  BP: 95/76 121/60 116/76 121/81  Pulse: 87 84 80 81  Resp: 19 (!) 26 (!) 27 20  Temp: 98.1 F (36.7 C)     TempSrc: Oral     SpO2: 99% (!) 85% 92% 99%  Weight:      Height:  Eyes: PERRL, lids and conjunctivae normal ENMT: Mucous membranes are dry.   Neck: normal, supple, no masses, no thyromegaly Respiratory: clear to auscultation bilaterally, no wheezing, no crackles.  Cardiovascular: Regular rate and rhythm, 3/6 systolic murmurs / rubs / gallops. No extremity edema.  Extremities warm. Abdomen: no tenderness, no masses palpated. No hepatosplenomegaly. Bowel sounds positive.  Musculoskeletal: no clubbing / cyanosis. No joint deformity upper and lower extremities.  Skin: no rashes, lesions, ulcers. No induration Neurologic:  No facial asymmetry, moving all extremities spontaneously, but she has poor coordination to her upper extremities, there is some intention tremor to bilateral upper extremity likely leading to the poor coordination, but this is not impressively abnormal considering her age.  She has 5/5 strength to proximal upper extremities, and good grip strength bilaterally, she is able to lift bilateral lower extremities against gravity, but unable to sustain this for more than 3 seconds.  Psychiatric: Slightly pressured speech, normal judgment and insight. Alert and oriented x 3. Normal mood.   Labs on Admission: I have personally reviewed following labs and imaging studies  CBC: Recent Labs  Lab 08/06/24 1351  WBC 5.8  NEUTROABS 5.1  HGB 12.0  HCT 34.7*  MCV 106.1*  PLT 250   Basic Metabolic Panel: Recent Labs  Lab 08/06/24 1351  NA 136  K 3.9  CL 96*  CO2 29  GLUCOSE 113*  BUN 53*   CREATININE 3.07*  CALCIUM 10.1  MG 2.7*   GFR: Estimated Creatinine Clearance: 13.9 mL/min (A) (by C-G formula based on SCr of 3.07 mg/dL (H)). Liver Function Tests: Recent Labs  Lab 08/06/24 1351  AST 20  ALT 6  ALKPHOS 123  BILITOT 0.7  PROT 7.3  ALBUMIN 4.4   Cardiac Enzymes: Recent Labs  Lab 08/06/24 1351  CKTOTAL 32*   Thyroid  Function Tests: Recent Labs    08/06/24 1354  TSH 0.212*   Anemia Panel: No results for input(s): VITAMINB12, FOLATE, FERRITIN, TIBC, IRON, RETICCTPCT in the last 72 hours. Urine analysis:    Component Value Date/Time   COLORURINE YELLOW 07/24/2024 1412   APPEARANCEUR CLEAR 07/24/2024 1412   LABSPEC 1.008 07/24/2024 1412   PHURINE 7.0 07/24/2024 1412   GLUCOSEU NEGATIVE 07/24/2024 1412   HGBUR NEGATIVE 07/24/2024 1412   BILIRUBINUR NEGATIVE 07/24/2024 1412   KETONESUR NEGATIVE 07/24/2024 1412   PROTEINUR NEGATIVE 07/24/2024 1412   UROBILINOGEN 0.2 12/27/2014 1228   NITRITE NEGATIVE 07/24/2024 1412   LEUKOCYTESUR NEGATIVE 07/24/2024 1412    Radiological Exams on Admission: MR BRAIN WO CONTRAST Result Date: 08/06/2024 CLINICAL DATA:  Altered mental status EXAM: MRI HEAD WITHOUT CONTRAST TECHNIQUE: Multiplanar, multiecho pulse sequences of the brain and surrounding structures were obtained without intravenous contrast. COMPARISON:  None Available. FINDINGS: MRI brain: The brain volume is normal. No significant signal abnormality in the brain parenchyma There is no acute or chronic infarct. The ventricles are normal. No mass lesion. There are normal flow signals in the carotid arteries and basilar artery. No significant bone marrow signal abnormality. No significant abnormality in the paranasal sinuses or soft tissues. IMPRESSION: No acute infarct or other significant abnormality Electronically Signed   By: Nancyann Burns M.D.   On: 08/06/2024 16:28   DG Chest 2 View Result Date: 08/06/2024 CLINICAL DATA:  Shaky. EXAM: CHEST -  2 VIEW COMPARISON:  July 24, 2024 FINDINGS: The cardiac silhouette is mildly enlarged and unchanged in size. There is marked severity calcification of the aortic arch. Mild atelectasis is seen within the bilateral  lung bases. No focal consolidation, pleural effusion or pneumothorax is identified. A chronic deformity is seen involving the mid left clavicle. IMPRESSION: Mild bibasilar atelectasis. Electronically Signed   By: Suzen Dials M.D.   On: 08/06/2024 14:17   EKG: Pending   Assessment/Plan Principal Problem:   AKI (acute kidney injury) Active Problems:   Breast cancer of upper-outer quadrant of left female breast (HCC)   Chronic kidney disease, stage 3b (HCC)  Assessment and Plan:  Acute kidney injury on CKD 3-creatinine 3 baseline 1.5-1.9.  No GI losses.  Reports stable oral intake.  On HCTZ and losartan.  Intermittent hypotensive episodes in ED down to 87/61 now improved. -1 L bolus given, continue N/s 75cc/hr x 1 day - Hold HCTZ and losartan  Generalized weakness, tremors-onset this morning.  COVID influenza RSV negative.  ED provider was concerned that patient had proximal muscle weakness resulting in ballistic movements of upper extremities. EDP consulted Dr. 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  Neurology was also concerned that ibrance  could be etiology. - My physical exam was more benign, she has some poor coordination and intention tremor. Reevaluate in a.m. - MRI brain negative for acute abnormality.  - PT eval after hydration - Low TSH- 0.212. Follow-up T3 and T4 levels  Stage IV left breast cancer-per last notes by Dr. Rogers on 01/12/2024, patient is on Ibrance  75 mg 3 weeks on 1 week off and anastrozole .  She was to follow-up in 4 months. -Per up-to-date-neurologic adverse effects of Ibrance -  asthenia and fatigue - Ibrance  held for now   DVT prophylaxis: Heparin    Code Status: Full code Family Communication: Patient's son Lamar at bedside Disposition Plan: ~ 2 days Consults called: None Admission status: Obs tele I certify that at the point of admission it is my clinical judgment that the patient will require inpatient hospital care spanning beyond 2 midnights from the point of admission due to high intensity of service, high risk for further deterioration and high frequency of surveillance required.    Author: Tully FORBES Carwin, MD 08/06/2024 8:31 PM  For on call review [email or web address].      [1] No Known Allergies  "

## 2024-08-06 NOTE — ED Notes (Signed)
 Patient is frequently incontinent.  Family states that's unusual for patient.  Family suspects some incorrect at-home meds and/or possible overdosing of prescribed meds.

## 2024-08-06 NOTE — ED Provider Notes (Signed)
" [place] EMERGENCY DEPARTMENT AT [facility] Provider Note   CSN: [id number] Arrival date & time: 08/06/24  1212     Patient presents with: Weakness   Maria Hardy is a 83 y.o. female who presents emergency department with a chief complaint of generalized weakness.  She has a past medical history of daily smoking, ductal carcinoma in situ, previous thyroid  tumor with removal.  She is currently under treatment for her cancer with oral Ibrance .  Patient reports that she was in her normal state of health yesterday however today she woke up and is having shaking in all of her extremities and weakness.  She reports she was fine when she went to bed but woke up unable to support herself with walking or hold her arms up.  Denies any recent falls.  She has some chronic back pain but no new back pain no headaches no vision changes.  She denies any other neurologic symptoms.  Patient also denies any recent gastrointestinal symptoms, vaccinations or flulike illnesses or fever.  She has never had this before.  She denies a history of liver disease.  She has not been confused.  Patient notably has slightly soft blood pressure.  Her sister at bedside states that she is worried about her because her blood pressure has been a little bit low. She is on hydrochlorothiazide but does not take torsemide or furosemide.    Weakness Abg      Prior to Admission medications  Medication Sig Start Date End Date Taking? Authorizing Provider  acetaminophen  (TYLENOL ) 500 MG tablet Take 500 mg by mouth every 6 (six) hours as needed (pain.).    [provider]  amLODipine (NORVASC) 10 MG tablet Take 10 mg by mouth every evening. 02/01/22   [provider]  anastrozole  (ARIMIDEX ) 1 MG tablet Take 1 tablet (1 mg total) by mouth daily. 12/16/23   Kandala, Hyndavi, MD  Ascorbic Acid (VITAMIN C PO) Take 1 tablet by mouth in the morning.    [provider]  busPIRone (BUSPAR) 10 MG tablet  Take 0.5 tablets 3 times a day by oral route as needed for 30 days, for Anxiety. 03/21/24   [provider]  Cholecalciferol (VITAMIN D -3 PO) Take 1 tablet by mouth in the morning.    [provider]  diclofenac  (VOLTAREN ) 75 MG EC tablet Take 1 tablet (75 mg total) by mouth 2 (two) times daily with a meal. 09/09/22   Margrette Taft BRAVO, MD  dicyclomine (BENTYL) 10 MG capsule Take 10 mg by mouth 3 (three) times daily. 10/11/22   [provider]  FEROSUL 325 (65 Fe) MG tablet Take 325 mg by mouth daily. 09/15/23   [provider]  folic acid  (FOLVITE ) 1 MG tablet Take 1 mg by mouth daily.    [provider]  gabapentin (NEURONTIN) 300 MG capsule take one capsule by mouth three times daily for pain    [provider]  hydrochlorothiazide (HYDRODIURIL) 12.5 MG tablet Take 12.5 mg by mouth every morning. 01/28/23   [provider]  HYDROcodone -acetaminophen  (NORCO/VICODIN) 5-325 MG tablet Take 1 tablet by mouth every 6 (six) hours as needed for moderate pain (pain score 4-6). 02/24/24   Margrette Taft BRAVO, MD  hydrOXYzine (ATARAX) 25 MG tablet Take 25 mg by mouth 3 (three) times daily. 05/05/22   [provider]  lidocaine  (LIDODERM ) 5 % Place 1 patch onto the skin daily. Remove & Discard patch within 12 hours or as directed  by MD 07/24/24   Idol, Julie, PA-C  losartan (COZAAR) 100 MG tablet Take 100 mg by mouth in the morning. 03/16/22   [provider]  Magnesium Oxide (MAG-OX PO) Take 1 tablet by mouth in the morning.    [provider]  meclizine  (ANTIVERT ) 25 MG tablet Take 25 mg by mouth 3 (three) times daily as needed for dizziness.    [provider]  metoprolol tartrate (LOPRESSOR) 25 MG tablet Take 25 mg by mouth 2 (two) times daily. 04/18/20   [provider]  Multiple Vitamin (MULTIVITAMIN WITH MINERALS) TABS tablet Take 1 tablet by mouth in the morning.    [provider]  Omega-3 Fatty  Acids (FISH OIL PO) Take 1 capsule by mouth in the morning.    [provider]  ondansetron  (ZOFRAN  ODT) 4 MG disintegrating tablet Take 1 tablet (4 mg total) by mouth every 8 (eight) hours as needed. 12/27/14   Zackowski, Scott, MD  palbociclib  (IBRANCE ) 100 MG tablet Take 1 tablet (100 mg total) by mouth daily. Take for 21 days on, 7 days off, repeat every 28 days. 05/16/24   Geofm Delon BRAVO, NP  predniSONE  (STERAPRED UNI-PAK 48 TAB) 10 MG (48) TBPK tablet Take by mouth daily. 10 mg ds 12 days as directed 02/24/24   Margrette Taft BRAVO, MD  TRULANCE  3 MG TABS Take 1 tablet every day by oral route, for stomach. 05/08/24   [provider]    Allergies: Patient has no known allergies.    Review of Systems  Neurological:  Positive for weakness.    Updated Vital Signs BP (!) 98/58   Pulse 70   Resp (!) 28   Ht 5' 6 (1.676 m)   Wt 72.5 kg   SpO2 91%   BMI 25.80 kg/m   Physical Exam HENT:     Head: Normocephalic and atraumatic.     Nose: Nose normal.     Mouth/Throat:     Mouth: Mucous membranes are moist.  Eyes:     General: No scleral icterus.    Extraocular Movements: Extraocular movements intact.     Pupils: Pupils are equal, round, and reactive to light.  Cardiovascular:     Rate and Rhythm: Normal rate.     Heart sounds: Murmur heard.  Pulmonary:     Effort: Pulmonary effort is normal.     Breath sounds: Normal breath sounds. No wheezing.  Abdominal:     General: Abdomen is flat. There is no distension.     Tenderness: There is no guarding.  Musculoskeletal:        General: Normal range of motion.     Cervical back: Normal range of motion and neck supple.  Neurological:     Mental Status: She is alert.     Motor: Weakness and tremor present.     Comments: Patient with ballistic like movements in the upper extremities when she tries to hold up her arms.  Reflexes are muted, no clonus at the ankle Unable to perform pronator test, F/n or heel to shin  due to weakness     (all labs ordered are listed, but only abnormal results are displayed) Labs Reviewed - No data to display  EKG: None  Radiology: No results found.   Procedures   Medications Ordered in the ED - No data to display  Clinical Course as of 08/06/24 1540  Mon Aug 06, 2024  1437 Case discussed with Dr. 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  Suspect that she will likely need to be admitted and cannot have any further workup done inpatient if needed.  [AH]    Clinical Course User Index [AH] Arloa Chroman, PA-C                                 Medical Decision Making Amount and/or Complexity of Data Reviewed Labs: ordered. Radiology: ordered. ECG/medicine tests: ordered.  Risk Decision regarding hospitalization.   This patient presents to the ED for concern of weakness and tremor, this involves an extensive number of treatment options, and is a complaint that carries with it a high risk of complications and morbidity.   The differential diagnosis of weakness includes but is not limited to neurologic causes (GBS, myasthenia gravis, CVA, MS, ALS, transverse myelitis, spinal cord injury, CVA, botulism, ) brain metastasis, induced myelopathy, paraneoplastic myeloneuropathy, spinal metastasis, and other causes: ACS, Arrhythmia, syncope, orthostatic hypotension, sepsis, hypoglycemia, electrolyte disturbance, hypothyroidism, respiratory failure, symptomatic anemia, dehydration, heat injury, polypharmacy, malignancy.   Co morbidities:  breast cancer smoking use of immune modulators for cancer treatment  Social Determinants of Health:   SDOH Screenings   Food Insecurity: No Food Insecurity (11/10/2022)  Housing: Low Risk (11/10/2022)  Transportation Needs: No Transportation Needs (11/10/2022)  Utilities: Not At Risk (11/10/2022)  Depression (PHQ2-9): Low Risk (05/17/2024)  Tobacco Use: High Risk  (08/06/2024)     Additional history:  {Additional history obtained from  family at bedside   Lab Tests:  I Ordered, and personally interpreted labs.  The pertinent results include:   Labs reviewed single elevated troponin, TSH is low at 0.2 Is only slightly elevated at 2.7 Reactine significantly elevated from baseline at 3.07 with GFR of 14 up from baseline 1.93 GFR CBC without significant abnormality CK within normal limits  Imaging Studies:  I ordered imaging studies including MRI brain w/wo however the order was changed MRI tech due to patient CrCl and Cr.  Chest x-ray shows no acute findings I independently visualized and interpreted imaging which showed no acute findings including no evidence of stroke infarct or mass at this time.  Will likely need follow-up MRI with contrast once the AKI has improved I agree with the radiologist interpretation      Test Considered:   considered MRI of the entire spine with and without to rule out metastasis however after discussion with Dr. Germaine she suggest this to be done inpatient   Critical Interventions:    Consultations Obtained:  Dr Germaine  Problem List / ED Course:     ICD-10-CM   1. Weakness  R53.1     2. Tremor  R25.1     3. AKI (acute kidney injury)  N17.9       MDM: Patient here with severe weakness in all extremities.  Movements are almost ballistic in nature she does not have a tremor.  Unsure of the exact cause at this time however she is unsafe to go home she has an associated AKI that needs to come and she is getting fluid resuscitation. Case discussed with Dr. Pearlean for admission         Final diagnoses:  Weakness  Tremor  AKI (acute kidney injury)    ED Discharge Orders     None          Arloa Chroman, PA-C 08/07/24 [id number]    Gennaro Duwaine CROME, DO 08/13/24  1537 ° °"

## 2024-08-06 NOTE — ED Triage Notes (Signed)
 Pt BIB [facility] from home for c/o weakness;  pt is a cancer pt and took her dose of chemo for the week and is not weak and does not have an appetite  Pt has no complaints of pain at this time

## 2024-08-07 DIAGNOSIS — G253 Myoclonus: Secondary | ICD-10-CM | POA: Diagnosis present

## 2024-08-07 DIAGNOSIS — G252 Other specified forms of tremor: Secondary | ICD-10-CM | POA: Diagnosis present

## 2024-08-07 DIAGNOSIS — R251 Tremor, unspecified: Secondary | ICD-10-CM | POA: Diagnosis present

## 2024-08-07 DIAGNOSIS — Z170 Estrogen receptor positive status [ER+]: Secondary | ICD-10-CM

## 2024-08-07 DIAGNOSIS — Z1152 Encounter for screening for COVID-19: Secondary | ICD-10-CM | POA: Diagnosis not present

## 2024-08-07 DIAGNOSIS — Z79811 Long term (current) use of aromatase inhibitors: Secondary | ICD-10-CM | POA: Diagnosis not present

## 2024-08-07 DIAGNOSIS — R531 Weakness: Secondary | ICD-10-CM | POA: Diagnosis present

## 2024-08-07 DIAGNOSIS — D0512 Intraductal carcinoma in situ of left breast: Secondary | ICD-10-CM | POA: Diagnosis not present

## 2024-08-07 DIAGNOSIS — C50412 Malignant neoplasm of upper-outer quadrant of left female breast: Secondary | ICD-10-CM | POA: Diagnosis present

## 2024-08-07 DIAGNOSIS — F17210 Nicotine dependence, cigarettes, uncomplicated: Secondary | ICD-10-CM | POA: Diagnosis present

## 2024-08-07 DIAGNOSIS — N179 Acute kidney failure, unspecified: Secondary | ICD-10-CM | POA: Diagnosis present

## 2024-08-07 DIAGNOSIS — N1832 Chronic kidney disease, stage 3b: Secondary | ICD-10-CM | POA: Diagnosis present

## 2024-08-07 DIAGNOSIS — I959 Hypotension, unspecified: Secondary | ICD-10-CM | POA: Diagnosis not present

## 2024-08-07 DIAGNOSIS — I129 Hypertensive chronic kidney disease with stage 1 through stage 4 chronic kidney disease, or unspecified chronic kidney disease: Secondary | ICD-10-CM | POA: Diagnosis present

## 2024-08-07 DIAGNOSIS — I2489 Other forms of acute ischemic heart disease: Secondary | ICD-10-CM | POA: Diagnosis present

## 2024-08-07 DIAGNOSIS — E890 Postprocedural hypothyroidism: Secondary | ICD-10-CM | POA: Diagnosis present

## 2024-08-07 DIAGNOSIS — Z79899 Other long term (current) drug therapy: Secondary | ICD-10-CM | POA: Diagnosis not present

## 2024-08-07 LAB — T3, FREE: T3, Free: 3.2 pg/mL (ref 2.0–4.4)

## 2024-08-07 LAB — CBC
HCT: 29.6 % — ABNORMAL LOW (ref 36.0–46.0)
Hemoglobin: 10.2 g/dL — ABNORMAL LOW (ref 12.0–15.0)
MCH: 36.8 pg — ABNORMAL HIGH (ref 26.0–34.0)
MCHC: 34.5 g/dL (ref 30.0–36.0)
MCV: 106.9 fL — ABNORMAL HIGH (ref 80.0–100.0)
Platelets: 221 K/uL (ref 150–400)
RBC: 2.77 MIL/uL — ABNORMAL LOW (ref 3.87–5.11)
RDW: 12.2 % (ref 11.5–15.5)
WBC: 4.5 K/uL (ref 4.0–10.5)
nRBC: 0 % (ref 0.0–0.2)

## 2024-08-07 LAB — BASIC METABOLIC PANEL WITH GFR
Anion gap: 16 — ABNORMAL HIGH (ref 5–15)
BUN: 45 mg/dL — ABNORMAL HIGH (ref 8–23)
CO2: 21 mmol/L — ABNORMAL LOW (ref 22–32)
Calcium: 9.2 mg/dL (ref 8.9–10.3)
Chloride: 104 mmol/L (ref 98–111)
Creatinine, Ser: 2.48 mg/dL — ABNORMAL HIGH (ref 0.44–1.00)
GFR, Estimated: 19 mL/min — ABNORMAL LOW
Glucose, Bld: 96 mg/dL (ref 70–99)
Potassium: 3.5 mmol/L (ref 3.5–5.1)
Sodium: 141 mmol/L (ref 135–145)

## 2024-08-07 MED ORDER — SODIUM CHLORIDE 0.9 % IV SOLN: INTRAVENOUS | Status: AC

## 2024-08-07 NOTE — Plan of Care (Signed)
   Problem: Activity: Goal: Risk for activity intolerance will decrease Outcome: Progressing   Problem: Coping: Goal: Level of anxiety will decrease Outcome: Progressing

## 2024-08-07 NOTE — Plan of Care (Signed)
" °  Problem: Acute Rehab OT Goals (only OT should resolve) Goal: Pt. Will Perform Grooming Flowsheets (Taken 08/07/2024 1459) Pt Will Perform Grooming:  with modified independence  standing Goal: Pt. Will Perform Lower Body Bathing Flowsheets (Taken 08/07/2024 1459) Pt Will Perform Lower Body Bathing:  with modified independence  sitting/lateral leans Goal: Pt. Will Perform Lower Body Dressing Flowsheets (Taken 08/07/2024 1459) Pt Will Perform Lower Body Dressing:  with modified independence  with adaptive equipment  sitting/lateral leans Goal: Pt. Will Transfer To Toilet Flowsheets (Taken 08/07/2024 1459) Pt Will Transfer to Toilet:  with modified independence  ambulating Goal: Pt. Will Perform Toileting-Clothing Manipulation Flowsheets (Taken 08/07/2024 1459) Pt Will Perform Toileting - Clothing Manipulation and hygiene: with modified independence Goal: Pt/Caregiver Will Perform Home Exercise Program Flowsheets (Taken 08/07/2024 1459) Pt/caregiver will Perform Home Exercise Program: Both right and left upper extremity -increased coordination Terris Bodin OT, MOT    "

## 2024-08-07 NOTE — [place] FL2 (Signed)
 " [place]  MEDICAID FL2 LEVEL OF CARE FORM     IDENTIFICATION  Patient Name: Maria Hardy Birthdate: 27-Jun-1941 Sex: female Admission Date (Current Location): 08/06/2024  [facility] and [place] Number:  [employer] and Address:  [facility],  [address] [place] [address]      Provider Number: [id number]  Attending Physician Name and Address:  Vicci Afton CROME, MD  Relative Name and Phone Number:       Current Level of Care: Hospital Recommended Level of Care: Skilled Nursing Facility Prior Approval Number:    Date Approved/Denied:   PASRR Number: [id number]  Discharge Plan: SNF    Current Diagnoses: Patient Active Problem List   Diagnosis Date Noted   AKI (acute kidney injury) 08/06/2024   Genetic testing 01/18/2024   Breast cancer of upper-outer quadrant of left female breast (HCC) 11/10/2022   Ductal carcinoma in situ (DCIS) of left breast 11/10/2022   Osteoporosis 11/10/2022   Chronic kidney disease, stage 3b (HCC) 08/25/2021    Orientation RESPIRATION BLADDER Height & Weight     Self, Time, Situation, Place  Normal Incontinent, External catheter Weight: 152 lb 5.4 oz (69.1 kg) Height:  5' 6 (167.6 cm)  BEHAVIORAL SYMPTOMS/MOOD NEUROLOGICAL BOWEL NUTRITION STATUS      Continent Diet (Regular)  AMBULATORY STATUS COMMUNICATION OF NEEDS Skin   Extensive Assist Verbally Normal                       Personal Care Assistance Level of Assistance  Bathing, Feeding, Dressing Bathing Assistance: Limited assistance Feeding assistance: Independent Dressing Assistance: Limited assistance     Functional Limitations Info  Sight, Hearing, Speech Sight Info: Impaired Hearing Info: Adequate Speech Info: Adequate    SPECIAL CARE FACTORS FREQUENCY  PT (By licensed PT), OT (By licensed OT)     PT Frequency: 5 times weekly OT Frequency: 5 times weekly            Contractures Contractures Info: Not present    Additional  Factors Info  Code Status, Allergies Code Status Info: FULL Allergies Info: NKA           Current Medications (08/07/2024):  This is the current hospital active medication list Current Facility-Administered Medications  Medication Dose Route Frequency Provider Last Rate Last Admin   0.9 %  sodium chloride  infusion   Intravenous Continuous Vicci, Clanford L, MD 50 mL/hr at 08/07/24 0914 New Bag at 08/07/24 0914   acetaminophen  (TYLENOL ) tablet 650 mg  650 mg Oral Q6H PRN Emokpae, Ejiroghene E, MD       Or   acetaminophen  (TYLENOL ) suppository 650 mg  650 mg Rectal Q6H PRN Emokpae, Ejiroghene E, MD       anastrozole  (ARIMIDEX ) tablet 1 mg  1 mg Oral QHS Emokpae, Ejiroghene E, MD       heparin  injection 5,000 Units  5,000 Units Subcutaneous Q8H Emokpae, Ejiroghene E, MD   5,000 Units at 08/07/24 0508   ondansetron  (ZOFRAN ) tablet 4 mg  4 mg Oral Q6H PRN Emokpae, Ejiroghene E, MD       Or   ondansetron  (ZOFRAN ) injection 4 mg  4 mg Intravenous Q6H PRN Emokpae, Ejiroghene E, MD       Plecanatide  TABS 3 mg  1 tablet Oral Daily Emokpae, Ejiroghene E, MD       polyethylene glycol (MIRALAX  / GLYCOLAX ) packet 17 g  17 g Oral Daily PRN Emokpae, Ejiroghene E, MD  Discharge Medications: Please see discharge summary for a list of discharge medications.  Relevant Imaging Results:  Relevant Lab Results:   Additional Information SSN: [id number] [address], [place]     "

## 2024-08-07 NOTE — Evaluation (Signed)
 Physical Therapy Evaluation Patient Details Name: Maria Hardy MRN: [id number] DOB: [birth year] Today's Date: 08/07/2024  History of Present Illness  Maria Hardy is a 83 y.o. female with medical history significant for breast cancer, CKD 3B.  Patient presented to the ED with complaints of weakness, tremors.  At the time of my evaluation patient is awake alert and able to answer questions, her son Lamar is at bedside.  She reports she was in her normal state of health until today when she woke up and feels like she was shaking all over, and felt weak.  No vomiting no diarrhea, she reports stable oral intake.  No headache, no nasal congestion no cough, no difficulty breathing no chest pain no abdominal pain.  No history of seizures.  Patient's son at bedside has not noticed any abnormal movement from patient.   Clinical Impression  Patient very unsteady on feet with tremors in extremities and jerky movement of legs resulting in near falls requiring Mod/max assist to maintain balance using SPC or RW. Patient requested to go back to bed due to fatigue after sitting up in chair since early AM. Patient will benefit from continued skilled physical therapy in hospital and recommended venue below to increase strength, balance, endurance for safe ADLs and gait.          If plan is discharge home, recommend the following: A lot of help with bathing/dressing/bathroom;A lot of help with walking and/or transfers;Help with stairs or ramp for entrance;Assist for transportation;Assistance with cooking/housework   Can travel by private vehicle   No    Equipment Recommendations None recommended by PT  Recommendations for Other Services       Functional Status Assessment Patient has had a recent decline in their functional status and demonstrates the ability to make significant improvements in function in a reasonable and predictable amount of time.     Precautions / Restrictions  Precautions Precautions: Fall Recall of Precautions/Restrictions: Intact Restrictions Weight Bearing Restrictions Per Provider Order: No      Mobility  Bed Mobility Overal bed mobility: Needs Assistance Bed Mobility: Supine to Sit, Sit to Supine     Supine to sit: Min assist Sit to supine: Min assist   General bed mobility comments: increased time, labored movement    Transfers Overall transfer level: Needs assistance Equipment used: Rolling walker (2 wheels) Transfers: Sit to/from Stand, Bed to chair/wheelchair/BSC Sit to Stand: Min assist, Mod assist   Step pivot transfers: Mod assist       General transfer comment: very unsteady with jerky movement of BLE with frequent near falls    Ambulation/Gait Ambulation/Gait assistance: Mod assist Gait Distance (Feet): 15 Feet Assistive device: Rolling walker (2 wheels) Gait Pattern/deviations: Decreased step length - right, Decreased step length - left, Decreased stride length, Knees buckling, Scissoring Gait velocity: slow     General Gait Details: slow labored movement with frequent jerky movements of BLE, tremors in extremities and limited to ambulating in room due to fatigue and fall risk  Stairs            Wheelchair Mobility     Tilt Bed    Modified Rankin (Stroke Patients Only)       Balance Overall balance assessment: Needs assistance Sitting-balance support: Feet supported, No upper extremity supported Sitting balance-Leahy Scale: Fair Sitting balance - Comments: seated at EOB   Standing balance support: During functional activity, Single extremity supported Standing balance-Leahy Scale: Poor Standing balance comment: poor using RW and SPC  Pertinent Vitals/Pain Pain Assessment Pain Assessment: No/denies pain    Home Living Family/patient expects to be discharged to:: Private residence Living Arrangements: Children Available Help at Discharge:  Family;Available PRN/intermittently Type of Home: Mobile home Home Access: Ramped entrance       Home Layout: One level Home Equipment: Agricultural Consultant (2 wheels);Cane - single point;Shower seat      Prior Function Prior Level of Function : Independent/Modified Independent             Mobility Comments: household and short community distances using [facility] ADLs Comments: Independent for most household ALDs, assisted for community     Extremity/Trunk Assessment   Upper Extremity Assessment Upper Extremity Assessment: Defer to OT evaluation    Lower Extremity Assessment Lower Extremity Assessment: Generalized weakness    Cervical / Trunk Assessment Cervical / Trunk Assessment: Kyphotic  Communication   Communication Communication: No apparent difficulties    Cognition Arousal: Alert Behavior During Therapy: WFL for tasks assessed/performed   PT - Cognitive impairments: No apparent impairments                         Following commands: Intact       Cueing Cueing Techniques: Verbal cues, Tactile cues     General Comments      Exercises     Assessment/Plan    PT Assessment Patient needs continued PT services  PT Problem List Decreased strength;Decreased activity tolerance;Decreased balance;Decreased mobility       PT Treatment Interventions DME instruction;Gait training;Stair training;Functional mobility training;Therapeutic activities;Therapeutic exercise;Balance training;Patient/family education    PT Goals (Current goals can be found in the Care Plan section)  Acute Rehab PT Goals Patient Stated Goal: return home with family to assist PT Goal Formulation: With patient/family Time For Goal Achievement: 08/21/24 Potential to Achieve Goals: Good    Frequency Min 3X/week     Co-evaluation               AM-PAC PT 6 Clicks Mobility  Outcome Measure Help needed turning from your back to your side while in a flat bed without using  bedrails?: A Little Help needed moving from lying on your back to sitting on the side of a flat bed without using bedrails?: A Little Help needed moving to and from a bed to a chair (including a wheelchair)?: A Lot Help needed standing up from a chair using your arms (e.g., wheelchair or bedside chair)?: A Lot Help needed to walk in hospital room?: A Lot Help needed climbing 3-5 steps with a railing? : Total 6 Click Score: 13    End of Session   Activity Tolerance: Patient tolerated treatment well;Patient limited by fatigue Patient left: in bed;with call bell/phone within reach Nurse Communication: Mobility status PT Visit Diagnosis: Unsteadiness on feet (R26.81);Other abnormalities of gait and mobility (R26.89);Muscle weakness (generalized) (M62.81)    Time: 8970-8946 PT Time Calculation (min) (ACUTE ONLY): 24 min   Charges:   PT Evaluation $PT Eval Moderate Complexity: 1 Mod PT Treatments $Therapeutic Activity: 23-37 mins PT General Charges $$ ACUTE PT VISIT: 1 Visit         2:22 PM, 08/07/2024 Lynwood Music, MPT Physical Therapist with [facility] [phone] office [phone] mobile phone

## 2024-08-07 NOTE — Plan of Care (Signed)

## 2024-08-07 NOTE — Evaluation (Signed)
 Occupational Therapy Evaluation Patient Details Name: Maria Hardy MRN: [id number] DOB: 1941-03-04 Today's Date: 08/07/2024   History of Present Illness   Maria Hardy is a 83 y.o. female with medical history significant for breast cancer, CKD 3B.  Patient presented to the ED with complaints of weakness, tremors.  At the time of my evaluation patient is awake alert and able to answer questions, her son Lamar is at bedside.  She reports she was in her normal state of health until today when she woke up and feels like she was shaking all over, and felt weak.  No vomiting no diarrhea, she reports stable oral intake.  No headache, no nasal congestion no cough, no difficulty breathing no chest pain no abdominal pain.  No history of seizures.  Patient's son at bedside has not noticed any abnormal movement from patient. (per MD)     Clinical Impressions Pt agreeable to OT evaluation. Pt required min A for bed mobility with tremors noted when pt attempted to sit upright. Mod A needed for EOB to chair transfer with RW with pt's legs buckling and much time needed. Mild B UE coordination deficits via tremors and finger to nose test. Pt unable to doff and don a sock today. Pt reported she does not usually wear socks at home. Pt at level of mod to max A for lower body ADL's based on observations today. Pt left in the chair with call bell within reach and family present. Pt will benefit from continued OT in the hospital to increase strength, balance, and endurance for safe ADL's.        If plan is discharge home, recommend the following:   A lot of help with walking and/or transfers;A lot of help with bathing/dressing/bathroom;Assistance with cooking/housework;Assist for transportation;Help with stairs or ramp for entrance     Functional Status Assessment   Patient has had a recent decline in their functional status and demonstrates the ability to make significant improvements in function in a  reasonable and predictable amount of time.     Equipment Recommendations   Tub/shower bench (Verbally educated that this might be beneficial if it will fit in the bathroom.)             Precautions/Restrictions   Precautions Precautions: Fall Recall of Precautions/Restrictions: Intact Restrictions Weight Bearing Restrictions Per Provider Order: No     Mobility Bed Mobility Overal bed mobility: Needs Assistance Bed Mobility: Supine to Sit     Supine to sit: Min assist     General bed mobility comments: Tremors with intentional movement noted; unsteady trunk control when attempting to sit up from a flat surface.    Transfers Overall transfer level: Needs assistance Equipment used: Rolling walker (2 wheels) Transfers: Sit to/from Stand, Bed to chair/wheelchair/BSC Sit to Stand: Min assist, Mod assist     Step pivot transfers: Mod assist     General transfer comment: EOB to chair with RW; unsteady; legs buckling/tremors      Balance Overall balance assessment: Needs assistance Sitting-balance support: Feet supported, No upper extremity supported Sitting balance-Leahy Scale: Poor Sitting balance - Comments: poor to fair seated at EOB   Standing balance support: During functional activity, Bilateral upper extremity supported Standing balance-Leahy Scale: Poor Standing balance comment: poor using RW                           ADL either performed or assessed with clinical judgement   ADL  Overall ADL's : Needs assistance/impaired     Grooming: Contact guard assist;Sitting   Upper Body Bathing: Contact guard assist;Sitting   Lower Body Bathing: Maximal assistance;Bed level;Sitting/lateral leans   Upper Body Dressing : Contact guard assist;Sitting   Lower Body Dressing: Maximal assistance;Sitting/lateral leans;Bed level   Toilet Transfer: Moderate assistance;Stand-pivot;Rolling walker (2 wheels) Toilet Transfer Details (indicate cue type and  reason): EOB to chair with RW Toileting- Clothing Manipulation and Hygiene: Moderate assistance;Maximal assistance;Sitting/lateral lean               Vision Baseline Vision/History: 1 Wears glasses Ability to See in Adequate Light: 0 Adequate Patient Visual Report: No change from baseline Vision Assessment?: No apparent visual deficits     Perception Perception: Not tested       Praxis Praxis: Not tested       Pertinent Vitals/Pain Pain Assessment Pain Assessment: No/denies pain     Extremity/Trunk Assessment Upper Extremity Assessment Upper Extremity Assessment:  (Mild decrease in gross motor coordination noted. WFL strength and A/ROM bilterally.)   Lower Extremity Assessment Lower Extremity Assessment: Defer to PT evaluation   Cervical / Trunk Assessment Cervical / Trunk Assessment: Kyphotic   Communication Communication Communication: No apparent difficulties   Cognition Arousal: Alert Behavior During Therapy: WFL for tasks assessed/performed Cognition: No apparent impairments                               Following commands: Intact       Cueing  General Comments   Cueing Techniques: Verbal cues;Tactile cues                 Home Living Family/patient expects to be discharged to:: Private residence Living Arrangements: Children Available Help at Discharge: Family;Available PRN/intermittently Type of Home: Mobile home Home Access: Ramped entrance     Home Layout: One level     Bathroom Shower/Tub: Tub/shower unit;Sponge bathes at baseline   Bathroom Toilet: Handicapped height Bathroom Accessibility: Yes   Home Equipment: Agricultural Consultant (2 wheels);Cane - single point;Shower seat   Additional Comments: per PT note      Prior Functioning/Environment Prior Level of Function : Independent/Modified Independent             Mobility Comments: household and short community distances using SPC; RW used seldom ADLs Comments:  Pt is assisted for sponge bathing. Independent other ADL's. Assist IADL's. (Verbally educated on possible benefit of tub bench to increase safety for tub/shower transfers.)    OT Problem List: Decreased activity tolerance;Impaired balance (sitting and/or standing);Decreased coordination   OT Treatment/Interventions: Self-care/ADL training;Therapeutic exercise;Neuromuscular education;DME and/or AE instruction;Therapeutic activities;Patient/family education;Balance training      OT Goals(Current goals can be found in the care plan section)   Acute Rehab OT Goals Patient Stated Goal: Improve function. OT Goal Formulation: With patient Time For Goal Achievement: 08/21/24 Potential to Achieve Goals: Good   OT Frequency:  Min 2X/week                                   End of Session Equipment Utilized During Treatment: Rolling walker (2 wheels);Gait belt  Activity Tolerance: Patient tolerated treatment well Patient left: in chair;with call bell/phone within reach;with family/visitor present  OT Visit Diagnosis: Unsteadiness on feet (R26.81);Muscle weakness (generalized) (M62.81);Other abnormalities of gait and mobility (R26.89)  Time: 8584-8564 OT Time Calculation (min): 20 min Charges:  OT General Charges $OT Visit: 1 Visit OT Evaluation $OT Eval Low Complexity: 1 Low  Legna Mausolf OT, MOT  Jayson Person 08/07/2024, 2:56 PM

## 2024-08-07 NOTE — Plan of Care (Signed)
" °  Problem: Acute Rehab PT Goals(only PT should resolve) Goal: Pt Will Go Supine/Side To Sit Outcome: Progressing Flowsheets (Taken 08/07/2024 1424) Pt will go Supine/Side to Sit:  with contact guard assist  with minimal assist Goal: Patient Will Transfer Sit To/From Stand Outcome: Progressing Flowsheets (Taken 08/07/2024 1424) Patient will transfer sit to/from stand:  with contact guard assist  with minimal assist Goal: Pt Will Transfer Bed To Chair/Chair To Bed Outcome: Progressing Flowsheets (Taken 08/07/2024 1424) Pt will Transfer Bed to Chair/Chair to Bed:  with contact guard assist  with min assist Goal: Pt Will Ambulate Outcome: Progressing Flowsheets (Taken 08/07/2024 1424) Pt will Ambulate:  25 feet  with minimal assist  with moderate assist  with rolling walker   2:25 PM, 08/07/2024 Lynwood Music, MPT Physical Therapist with [facility] [phone] office [phone] mobile phone  "

## 2024-08-07 NOTE — Progress Notes (Signed)
 Mobility Specialist Progress Note:    08/07/24 1450  Mobility  Activity Pivoted/transferred from chair to bed  Level of Assistance Minimal assist, patient does 75% or more  Assistive Device None  Distance Ambulated (ft) 3 ft  Range of Motion/Exercises Active;All extremities  Activity Response Tolerated well  Mobility Referral Yes  Mobility visit 1 Mobility  Mobility Specialist Start Time (ACUTE ONLY) 1450  Mobility Specialist Stop Time (ACUTE ONLY) 1510  Mobility Specialist Time Calculation (min) (ACUTE ONLY) 20 min   Pt received in chair, requesting assistance to bed. Required MinA to stand and transfer with no AD. Tolerated well, pt a little shaky. Family in room, alarm on. All needs met.  Sobia Karger Mobility Specialist Please contact via Special Educational Needs Teacher or  Rehab office at [phone]

## 2024-08-07 NOTE — TOC Initial Note (Signed)
 Transition of Care [facility]) - Initial/Assessment Note    Patient Details  Name: Maria Hardy MRN: [id number] Date of Birth: 13-Mar-1941  Transition of Care [facility]) CM/SW Contact:    Lucie Lunger, LCSWA Phone Number: 08/07/2024, 11:31 AM  Clinical Narrative:                 CSW updated that PT is recommending SNF for pt at D/C. CSW met with pt at bedside to complete assessment. Pt states her son lives with her. Pt is independent in completing her ADLs. Pt states she has transportation provided by family. Pt has a cane to use if needed. Pt states she is agreeable to SNF placement but prefers SNF in [place] if possible. CSW explained that referral will be sent out for review. TOC to follow.   Expected Discharge Plan: Skilled Nursing Facility Barriers to Discharge: Continued Medical Work up   Patient Goals and CMS Choice Patient states their goals for this hospitalization and ongoing recovery are:: get stronger CMS Medicare.gov Compare Post Acute Care list provided to:: Patient Choice offered to / list presented to : Patient [place] ownership interest in [facility].provided to:: Patient    Expected Discharge Plan and Services In-house Referral: Clinical Social Work Discharge Planning Services: CM Consult Post Acute Care Choice: Skilled Nursing Facility Living arrangements for the past 2 months: Single Family Home                                      Prior Living Arrangements/Services Living arrangements for the past 2 months: Single Family Home Lives with:: Adult Children Patient language and need for interpreter reviewed:: Yes Do you feel safe going back to the place where you live?: Yes      Need for Family Participation in Patient Care: Yes (Comment) Care giver support system in place?: Yes (comment) Current home services: DME Criminal Activity/Legal Involvement Pertinent to Current Situation/Hospitalization: No - Comment as needed  Activities of  Daily Living   ADL Screening (condition at time of admission) Independently performs ADLs?: Yes (appropriate for developmental age) Is the patient deaf or have difficulty hearing?: No Does the patient have difficulty seeing, even when wearing glasses/contacts?: No Does the patient have difficulty concentrating, remembering, or making decisions?: No  Permission Sought/Granted                  Emotional Assessment Appearance:: Appears stated age Attitude/Demeanor/Rapport: Engaged Affect (typically observed): Accepting Orientation: : Oriented to Self, Oriented to Place, Oriented to  Time, Oriented to Situation Alcohol / Substance Use: Not Applicable Psych Involvement: No (comment)  Admission diagnosis:  Tremor [R25.1] Weakness [R53.1] AKI (acute kidney injury) [N17.9] Patient Active Problem List   Diagnosis Date Noted   AKI (acute kidney injury) 08/06/2024   Genetic testing 01/18/2024   Breast cancer of upper-outer quadrant of left female breast (HCC) 11/10/2022   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 [place] [address] Phone: [phone] Fax: [phone]     Social Drivers of Health (SDOH) Social History: SDOH Screenings  Food Insecurity: No Food Insecurity (08/06/2024)  Housing: Low Risk (08/06/2024)  Transportation Needs: No Transportation Needs (08/06/2024)  Utilities: Not At Risk (08/06/2024)  Depression (PHQ2-9): Low Risk (05/17/2024)  Social Connections: Unknown (08/06/2024)  Tobacco Use: High Risk (08/06/2024)   SDOH Interventions:     Readmission Risk Interventions     No data to display

## 2024-08-07 NOTE — Hospital Course (Addendum)
 83 y.o. female with medical history significant for breast cancer, CKD 3B. Patient presented to the ED with complaints of weakness, tremors.  At the time of my evaluation patient is awake alert and able to answer questions, her son Lamar is at bedside.  She reports she was in her normal state of health until today when she woke up and feels like she was shaking all over, and felt weak.  No vomiting no diarrhea, she reports stable oral intake.  No headache, no nasal congestion no cough, no difficulty breathing no chest pain no abdominal pain.  No history of seizures.  Patient's son at bedside has not noticed any abnormal movement from patient.   Temperature 98.1.  Heart rate 60-91.  Respiratory rate 15-27.  Blood pressure systolic down to 12/38 improved to 120s.  O2 sats mostly greater than 90% on room air.  Creatinine elevated at 3.07.  Magnesium 2.7  MRI brain Wo-no acute infarct or other significant abnormality.   Pt was admitted for acute kidney injury.  Patient was started on IV fluids with improvement of her renal function back to baseline.  The patient's tremors improved.  In retrospect, her abnormal movements likely represented myoclonus in the setting of acute on chronic renal failure.  There are no focal neurologic deficits at the time of discharge and the patient's tremors were completely resolved.

## 2024-08-07 NOTE — Progress Notes (Signed)
 Patient remained alert and oriented x4 throughout shift. During activitiy, patient observed to have tremors in upper extremities, especially if attempting to hold arm in raised position. Dr. Vicci notified of this finding and communicates that additional tests will be ordered.

## 2024-08-07 NOTE — Progress Notes (Signed)
 " PROGRESS NOTE   Maria Hardy [facility]  FMW:[id number] DOB: 26-Mar-1941 DOA: 08/06/2024 PCP: Vick Lurie, FNP (Inactive)   Chief Complaint  Patient presents with   Weakness   Level of care: Telemetry  Brief Admission History:  83 y.o. female with medical history significant for breast cancer, CKD 3B. Patient presented to the ED with complaints of weakness, tremors.  At the time of my evaluation patient is awake alert and able to answer questions, her son Lamar is at bedside.  She reports she was in her normal state of health until today when she woke up and feels like she was shaking all over, and felt weak.  No vomiting no diarrhea, she reports stable oral intake.  No headache, no nasal congestion no cough, no difficulty breathing no chest pain no abdominal pain.  No history of seizures.  Patient's son at bedside has not noticed any abnormal movement from patient.   Temperature 98.1.  Heart rate 60-91.  Respiratory rate 15-27.  Blood pressure systolic down to 12/38 improved to 120s.  O2 sats mostly greater than 90% on room air.  Creatinine elevated at 3.07.  Magnesium 2.7  MRI brain Wo-no acute infarct or other significant abnormality.   Pt was admitted for acute kidney injury.    Assessment and Plan:  Acute kidney injury on CKD 3 -creatinine peaked at 3  -baseline 1.5-1.9.   -Improving on IV fluid, continue for another 24 hours -Hold HCTZ and losartan temporarily to allow renal recovery -recheck BMP in AM    Generalized weakness,  Resting tremor   -COVID influenza RSV negative.  ED provider was concerned that patient had proximal muscle weakness resulting in ballistic movements of upper extremities. EDP consulted neurologist Dr. 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  Neurology was also concerned that ibrance  could be etiology. - symptoms seemed to be improved on my exam today; continue to  follow  - MRI brain negative for acute abnormality.  - PT/OT eval with recommendation for SNF rehab placement - TOC consulted  - Low TSH- 0.212. Follow-up T3 and T4 levels   Stage IV left breast cancer-per last notes by Dr. Rogers on 01/12/2024, patient is on Ibrance  75 mg 3 weeks on 1 week off and anastrozole .  She was to follow-up in 4 months. -Per up-to-date-neurologic adverse effects of Ibrance -  asthenia and fatigue - Ibrance  held for now    DVT prophylaxis: heparin  Code Status: Full  Family Communication:  Disposition: anticipating need for SNF rehab   Consultants:  PT/OT  Procedures:   Antimicrobials:    Subjective: Pt reports that she has been feeling very weak lately.  She does not have much appetite.  Objective: Vitals:   08/07/24 0007 08/07/24 0412 08/07/24 0744 08/07/24 1231  BP: 116/72 (!) 108/57 124/68 (!) 117/57  Pulse: 75 68 67 69  Resp: 19 18    Temp: 99.3 F (37.4 C) 98.8 F (37.1 C)  98 F (36.7 C)  TempSrc: Oral Oral  Oral  SpO2: 91% 95% 93% 94%  Weight:      Height:        Intake/Output Summary (Last 24 hours) at 08/07/2024 1709 Last data filed at 08/07/2024 1527 Gross per 24 hour  Intake 297.62 ml  Output 400 ml  Net -102.38 ml   Filed Weights   08/06/24 1225 08/06/24 2001  Weight: 72.5 kg 69.1 kg   Examination:  General exam: Appears  calm and comfortable  Respiratory system: Clear to auscultation. Respiratory effort normal. Cardiovascular system: normal S1 & S2 heard. No JVD, murmurs, rubs, gallops or clicks. No pedal edema. Gastrointestinal system: Abdomen is nondistended, soft and nontender. No organomegaly or masses felt. Normal bowel sounds heard. Central nervous system: Alert and oriented. No focal neurological deficits. Extremities: Symmetric 5 x 5 power. Skin: No rashes, lesions or ulcers. Psychiatry: Judgement and insight appear normal. Mood & affect appropriate.   Data Reviewed: I have personally reviewed following labs  and imaging studies  CBC: Recent Labs  Lab 08/06/24 1351 08/07/24 0414  WBC 5.8 4.5  NEUTROABS 5.1  --   HGB 12.0 10.2*  HCT 34.7* 29.6*  MCV 106.1* 106.9*  PLT 250 221    Basic Metabolic Panel: Recent Labs  Lab 08/06/24 1351 08/07/24 0414  NA 136 141  K 3.9 3.5  CL 96* 104  CO2 29 21*  GLUCOSE 113* 96  BUN 53* 45*  CREATININE 3.07* 2.48*  CALCIUM 10.1 9.2  MG 2.7*  --     CBG: No results for input(s): GLUCAP in the last 168 hours.  Recent Results (from the past 240 hours)  Resp panel by RT-PCR (RSV, Flu A&B, Covid) Anterior Nasal Swab     Status: None   Collection Time: 08/06/24  1:54 PM   Specimen: Anterior Nasal Swab  Result Value Ref Range Status   SARS Coronavirus 2 by RT PCR NEGATIVE NEGATIVE Final    Comment: (NOTE) SARS-CoV-2 target nucleic acids are NOT DETECTED.  The SARS-CoV-2 RNA is generally detectable in upper respiratory specimens during the acute phase of infection. The lowest concentration of SARS-CoV-2 viral copies this assay can detect is 138 copies/mL. A negative result does not preclude SARS-Cov-2 infection and should not be used as the sole basis for treatment or other patient management decisions. A negative result may occur with  improper specimen collection/handling, submission of specimen other than nasopharyngeal swab, presence of viral mutation(s) within the areas targeted by this assay, and inadequate number of viral copies(<138 copies/mL). A negative result must be combined with clinical observations, patient history, and epidemiological information. The expected result is Negative.  Fact Sheet for Patients:  bloggercourse.com  Fact Sheet for Healthcare Providers:  seriousbroker.it  This test is no t yet approved or cleared by the [place]  FDA and  has been authorized for detection and/or diagnosis of SARS-CoV-2 by FDA under an Emergency Use Authorization (EUA). This  EUA will remain  in effect (meaning this test can be used) for the duration of the COVID-19 declaration under Section 564(b)(1) of the Act, 21 U.S.C.section 360bbb-3(b)(1), unless the authorization is terminated  or revoked sooner.       Influenza A by PCR NEGATIVE NEGATIVE Final   Influenza B by PCR NEGATIVE NEGATIVE Final    Comment: (NOTE) The Xpert Xpress SARS-CoV-2/FLU/RSV plus assay is intended as an aid in the diagnosis of influenza from Nasopharyngeal swab specimens and should not be used as a sole basis for treatment. Nasal washings and aspirates are unacceptable for Xpert Xpress SARS-CoV-2/FLU/RSV testing.  Fact Sheet for Patients: bloggercourse.com  Fact Sheet for Healthcare Providers: seriousbroker.it  This test is not yet approved or cleared by the [place]  FDA and has been authorized for detection and/or diagnosis of SARS-CoV-2 by FDA under an Emergency Use Authorization (EUA). This EUA will remain in effect (meaning this test can be used) for the duration of the COVID-19 declaration under Section 564(b)(1) of the Act, 21  U.S.C. section 360bbb-3(b)(1), unless the authorization is terminated or revoked.     Resp Syncytial Virus by PCR NEGATIVE NEGATIVE Final    Comment: (NOTE) Fact Sheet for Patients: bloggercourse.com  Fact Sheet for Healthcare Providers: seriousbroker.it  This test is not yet approved or cleared by the [place]  FDA and has been authorized for detection and/or diagnosis of SARS-CoV-2 by FDA under an Emergency Use Authorization (EUA). This EUA will remain in effect (meaning this test can be used) for the duration of the COVID-19 declaration under Section 564(b)(1) of the Act, 21 U.S.C. section 360bbb-3(b)(1), unless the authorization is terminated or revoked.  Performed at [facility], [address], [place], [place] [address]       Radiology Studies: MR BRAIN WO CONTRAST Result Date: 08/06/2024 CLINICAL DATA:  Altered mental status EXAM: MRI HEAD WITHOUT CONTRAST TECHNIQUE: Multiplanar, multiecho pulse sequences of the brain and surrounding structures were obtained without intravenous contrast. COMPARISON:  None Available. FINDINGS: MRI brain: The brain volume is normal. No significant signal abnormality in the brain parenchyma There is no acute or chronic infarct. The ventricles are normal. No mass lesion. There are normal flow signals in the carotid arteries and basilar artery. No significant bone marrow signal abnormality. No significant abnormality in the paranasal sinuses or soft tissues. IMPRESSION: No acute infarct or other significant abnormality Electronically Signed   By: Nancyann Burns M.D.   On: 08/06/2024 16:28   DG Chest 2 View Result Date: 08/06/2024 CLINICAL DATA:  Shaky. EXAM: CHEST - 2 VIEW COMPARISON:  July 24, 2024 FINDINGS: The cardiac silhouette is mildly enlarged and unchanged in size. There is marked severity calcification of the aortic arch. Mild atelectasis is seen within the bilateral lung bases. No focal consolidation, pleural effusion or pneumothorax is identified. A chronic deformity is seen involving the mid left clavicle. IMPRESSION: Mild bibasilar atelectasis. Electronically Signed   By: Suzen Dials M.D.   On: 08/06/2024 14:17    Scheduled Meds:  anastrozole   1 mg Oral QHS   heparin   5,000 Units Subcutaneous Q8H   Plecanatide   1 tablet Oral Daily   Continuous Infusions:  sodium chloride  50 mL/hr at 08/07/24 1527     LOS: 0 days   Time spent: 55 mins  Jahzara Slattery Vicci, MD How to contact the [facility] Attending or Consulting provider 7A - 7P or covering provider during after hours 7P -7A, for this patient?  Check the care team in [facility] and look for a) attending/consulting [facility] provider listed and b) the [facility] team listed Log into [email or web address] to find provider on call.  Locate the [facility]  provider you are looking for under [facility] and page to a number that you can be directly reached. If you still have difficulty reaching the provider, please page the [facility] (Director on Call) for the Hospitalists listed on amion for assistance.  08/07/2024, 5:09 PM    "

## 2024-08-08 DIAGNOSIS — F411 Generalized anxiety disorder: Secondary | ICD-10-CM | POA: Insufficient documentation

## 2024-08-08 DIAGNOSIS — D0512 Intraductal carcinoma in situ of left breast: Secondary | ICD-10-CM | POA: Diagnosis not present

## 2024-08-08 DIAGNOSIS — N1832 Chronic kidney disease, stage 3b: Secondary | ICD-10-CM

## 2024-08-08 DIAGNOSIS — R251 Tremor, unspecified: Secondary | ICD-10-CM | POA: Diagnosis not present

## 2024-08-08 DIAGNOSIS — N179 Acute kidney failure, unspecified: Secondary | ICD-10-CM | POA: Diagnosis not present

## 2024-08-08 DIAGNOSIS — R279 Unspecified lack of coordination: Secondary | ICD-10-CM | POA: Insufficient documentation

## 2024-08-08 LAB — CBC
HCT: 28.8 % — ABNORMAL LOW (ref 36.0–46.0)
Hemoglobin: 9.7 g/dL — ABNORMAL LOW (ref 12.0–15.0)
MCH: 36.2 pg — ABNORMAL HIGH (ref 26.0–34.0)
MCHC: 33.7 g/dL (ref 30.0–36.0)
MCV: 107.5 fL — ABNORMAL HIGH (ref 80.0–100.0)
Platelets: 192 K/uL (ref 150–400)
RBC: 2.68 MIL/uL — ABNORMAL LOW (ref 3.87–5.11)
RDW: 12.3 % (ref 11.5–15.5)
WBC: 4.5 K/uL (ref 4.0–10.5)
nRBC: 0 % (ref 0.0–0.2)

## 2024-08-08 LAB — BASIC METABOLIC PANEL WITH GFR
Anion gap: 14 (ref 5–15)
BUN: 37 mg/dL — ABNORMAL HIGH (ref 8–23)
CO2: 21 mmol/L — ABNORMAL LOW (ref 22–32)
Calcium: 8.7 mg/dL — ABNORMAL LOW (ref 8.9–10.3)
Chloride: 106 mmol/L (ref 98–111)
Creatinine, Ser: 1.92 mg/dL — ABNORMAL HIGH (ref 0.44–1.00)
GFR, Estimated: 25 mL/min — ABNORMAL LOW
Glucose, Bld: 91 mg/dL (ref 70–99)
Potassium: 3.7 mmol/L (ref 3.5–5.1)
Sodium: 141 mmol/L (ref 135–145)

## 2024-08-08 LAB — FOLATE: Folate: 10.5 ng/mL

## 2024-08-08 LAB — VITAMIN B12: Vitamin B-12: 379 pg/mL (ref 180–914)

## 2024-08-08 MED ORDER — VITAMIN B-12 100 MCG PO TABS
500.0000 ug | ORAL_TABLET | Freq: Every day | ORAL | Status: DC
  Filled 2024-08-08: qty 5

## 2024-08-08 MED ORDER — LACTATED RINGERS IV SOLN: INTRAVENOUS | Status: DC

## 2024-08-08 MED ORDER — CYANOCOBALAMIN 500 MCG PO TABS: 500.0000 ug | ORAL_TABLET | Freq: Every day | ORAL | Status: AC

## 2024-08-08 MED ORDER — PALBOCICLIB 100 MG PO TABS: 100.0000 mg | ORAL_TABLET | Freq: Every day | ORAL | Status: DC

## 2024-08-08 NOTE — Progress Notes (Signed)
 Mobility Specialist Progress Note:    08/08/24 1030  Mobility  Activity Ambulated with assistance  Level of Assistance Minimal assist, patient does 75% or more  Assistive Device Front wheel walker  Distance Ambulated (ft) 7 ft  Range of Motion/Exercises Active;All extremities  Activity Response Tolerated well  Mobility Referral Yes  Mobility visit 1 Mobility  Mobility Specialist Start Time (ACUTE ONLY) 1030  Mobility Specialist Stop Time (ACUTE ONLY) 1050  Mobility Specialist Time Calculation (min) (ACUTE ONLY) 20 min   Pt received supine, assisting with transfer to bathroom. Required MinA to stand and SBA to ambulate with RW. Tolerated well, family and NT in room. All needs met.  Sheikh Leverich Mobility Specialist Please contact via Special Educational Needs Teacher or  Rehab office at [phone]

## 2024-08-08 NOTE — Discharge Summary (Addendum)
 " Physician Discharge Summary   Patient: Maria Hardy MRN: [id number] DOB: 30-Dec-1940  Admit date:     08/06/2024  Discharge date: 08/08/2024  Discharge Physician: Alm Trace Cederberg   PCP: Vick Lurie, FNP (Inactive)   Recommendations at discharge:   Please follow up with primary care provider within 1-2 weeks  Please repeat BMP and CBC in one week     Hospital Course: 83 y.o. female with medical history significant for breast cancer, CKD 3B. Patient presented to the ED with complaints of weakness, tremors.  At the time of my evaluation patient is awake alert and able to answer questions, her son Lamar is at bedside.  She reports she was in her normal state of health until today when she woke up and feels like she was shaking all over, and felt weak.  No vomiting no diarrhea, she reports stable oral intake.  No headache, no nasal congestion no cough, no difficulty breathing no chest pain no abdominal pain.  No history of seizures.  Patient's son at bedside has not noticed any abnormal movement from patient.   Temperature 98.1.  Heart rate 60-91.  Respiratory rate 15-27.  Blood pressure systolic down to 12/38 improved to 120s.  O2 sats mostly greater than 90% on room air.  Creatinine elevated at 3.07.  Magnesium 2.7  MRI brain Wo-no acute infarct or other significant abnormality.   Pt was admitted for acute kidney injury.  Patient was started on IV fluids with improvement of her renal function back to baseline.  The patient's tremors improved.  In retrospect, her abnormal movements likely represented myoclonus in the setting of acute on chronic renal failure.  There are no focal neurologic deficits at the time of discharge and the patient's tremors were completely resolved.  Assessment and Plan: Acute kidney injury on CKD 3 -creatinine peaked at 3.07 -baseline creatinine 1.5-1.9.   -Improving on IV fluid, continue for another 24 hours -Hold HCTZ and losartan temporarily to allow renal  recovery -serum creatinine 1.92 on day of dc   Generalized weakness/?Tremor  -COVID influenza RSV negative.  ED provider was concerned that patient had proximal muscle weakness resulting in ballistic movements of upper extremities. EDP consulted neurologist Dr. 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  Neurology was also concerned that ibrance  could be etiology. - symptoms seemed to be improved on my exam today; continue to follow  - MRI brain without contrast negative for acute abnormality.  - PT/OT eval with recommendation for SNF rehab placement - TOC consulted  - Low TSH- 0.212. Free T4= 1.29 - in retrospect, tremor likely represents myoclonus due to acute on CKD 3>>now completely resolved without any focal deficit - CK 32 - check B12-pending at time of dc - check  folate>pending at time of dc - pt has no s/s of cognitive impairment   Stage IV left breast cancer -per last notes by Dr. Rogers on 01/12/2024, patient is on Ibrance  75 mg 3 weeks on 1 week off and anastrozole .  She was to follow-up in 4 months. - pt is currently on her off week for Ibrance  -Per up-to-date-neurologic adverse effects of Ibrance -  asthenia and fatigue - Ibrance  will be restarted on her next on week starting 08/12/24  Essential HTN -will not restart losartan, hydrochlorothiazide, metoprolol -BP remains well controlled during the hospitalization without any HTN meds -follow up with PCP   Elevated troponin -No chest pain - Secondary  to demand ischemia       Consultants: none Procedures performed: none  Disposition: [facility] Diet recommendation:  Regular diet DISCHARGE MEDICATION: Allergies as of 08/08/2024   No Known Allergies      Medication List     STOP taking these medications    hydrochlorothiazide 12.5 MG tablet Commonly known as: HYDRODIURIL   HYDROcodone -acetaminophen  5-325 MG  tablet Commonly known as: NORCO/VICODIN   losartan 50 MG tablet Commonly known as: COZAAR   metoprolol tartrate 25 MG tablet Commonly known as: LOPRESSOR       TAKE these medications    acetaminophen  500 MG tablet Commonly known as: TYLENOL  Take 500 mg by mouth every 6 (six) hours as needed (pain.).   anastrozole  1 MG tablet Commonly known as: ARIMIDEX  Take 1 tablet (1 mg total) by mouth daily.   busPIRone 10 MG tablet Commonly known as: BUSPAR Take 0.5 tablets 3 times a day by oral route as needed for 30 days, for Anxiety.   cyanocobalamin  500 MCG tablet Commonly known as: VITAMIN B12 Take 1 tablet (500 mcg total) by mouth daily.   dicyclomine 10 MG capsule Commonly known as: BENTYL Take 10 mg by mouth daily.   FISH OIL PO Take 1 capsule by mouth in the morning.   folic acid  1 MG tablet Commonly known as: FOLVITE  Take 1 mg by mouth daily.   lidocaine  5 % Commonly known as: Lidoderm  Place 1 patch onto the skin daily. Remove & Discard patch within 12 hours or as directed by MD   meclizine  25 MG tablet Commonly known as: ANTIVERT  Take 25 mg by mouth 3 (three) times daily as needed for dizziness.   multivitamin with minerals Tabs tablet Take 1 tablet by mouth in the morning.   ondansetron  4 MG disintegrating tablet Commonly known as: Zofran  ODT Take 1 tablet (4 mg total) by mouth every 8 (eight) hours as needed.   palbociclib  100 MG tablet Commonly known as: IBRANCE  Take 1 tablet (100 mg total) by mouth daily. Take for 21 days on, 7 days off, repeat every 28 days. Start taking on: August 12, 2024 What changed: These instructions start on August 12, 2024. If you are unsure what to do until then, ask your doctor or other care provider.   Trulance  3 MG Tabs Generic drug: Plecanatide  Take 1 tablet by mouth daily.   VITAMIN C PO Take 1 tablet by mouth in the morning.   VITAMIN D -3 PO Take 1 tablet by mouth in the morning.        Discharge  Exam: Filed Weights   08/06/24 1225 08/06/24 2001  Weight: 72.5 kg 69.1 kg   HEENT:  [place]/AT, No thrush, no icterus CV:  RRR, no rub, no S3, no S4 Lung:  CTA, no wheeze, no rhonchi Abd:  soft/+BS, NT Ext:  No edema, no lymphangitis, no synovitis, no rash Neuro:  CN II-XII intact, strength 4/5 in RUE, RLE, strength 4/5 LUE, LLE; sensation intact bilateral; no dysmetria; babinski equivocal   Condition at discharge: stable  The results of significant diagnostics from this hospitalization (including imaging, microbiology, ancillary and laboratory) are listed below for reference.   Imaging Studies: MR BRAIN WO CONTRAST Result Date: 08/06/2024 CLINICAL DATA:  Altered mental status EXAM: MRI HEAD WITHOUT CONTRAST TECHNIQUE: Multiplanar, multiecho pulse sequences of the brain and surrounding structures were obtained without intravenous contrast. COMPARISON:  None Available. FINDINGS: MRI brain: The brain volume is normal. No significant signal abnormality in the brain parenchyma There  is no acute or chronic infarct. The ventricles are normal. No mass lesion. There are normal flow signals in the carotid arteries and basilar artery. No significant bone marrow signal abnormality. No significant abnormality in the paranasal sinuses or soft tissues. IMPRESSION: No acute infarct or other significant abnormality Electronically Signed   By: Nancyann Burns M.D.   On: 08/06/2024 16:28   DG Chest 2 View Result Date: 08/06/2024 CLINICAL DATA:  Shaky. EXAM: CHEST - 2 VIEW COMPARISON:  July 24, 2024 FINDINGS: The cardiac silhouette is mildly enlarged and unchanged in size. There is marked severity calcification of the aortic arch. Mild atelectasis is seen within the bilateral lung bases. No focal consolidation, pleural effusion or pneumothorax is identified. A chronic deformity is seen involving the mid left clavicle. IMPRESSION: Mild bibasilar atelectasis. Electronically Signed   By: Suzen Dials M.D.   On:  08/06/2024 14:17   DG Chest Portable 1 View Result Date: 07/24/2024 CLINICAL DATA:  Drowsiness. EXAM: PORTABLE CHEST 1 VIEW COMPARISON:  Chest radiograph dated 01/29/2015. FINDINGS: No focal consolidation, pleural effusion or pneumothorax. There is diffuse chronic interstitial coarsening. The cardiac silhouette is within normal limits. Atherosclerotic calcification of the aorta. Osteopenia with degenerative changes of spine. No acute osseous pathology. 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 IMPRESSION: No active disease. Electronically Signed   By: Vanetta Chou M.D.   On: 07/24/2024 15:27    Microbiology: Results for orders placed or performed during the hospital encounter of 08/06/24  Resp panel by RT-PCR (RSV, Flu A&B, Covid) Anterior Nasal Swab     Status: None   Collection Time: 08/06/24  1:54 PM   Specimen: Anterior Nasal Swab  Result Value Ref Range Status   SARS Coronavirus 2 by RT PCR NEGATIVE NEGATIVE Final    Comment: (NOTE) SARS-CoV-2 target nucleic acids are NOT DETECTED.  The SARS-CoV-2 RNA is generally detectable in upper respiratory specimens during the acute phase of infection. The lowest concentration of SARS-CoV-2 viral copies this assay can detect is 138 copies/mL. A negative result does not preclude SARS-Cov-2 infection and should not be used as the sole basis for treatment or other patient management decisions. A negative result may occur with  improper specimen collection/handling, submission of specimen other than nasopharyngeal swab, presence of viral mutation(s) within the areas targeted by this assay, and inadequate number of viral copies(<138 copies/mL). A negative result must be combined with clinical observations, patient history, and epidemiological information. The expected result is Negative.  Fact Sheet for Patients:  bloggercourse.com  Fact Sheet for Healthcare Providers:   seriousbroker.it  This test is no t yet approved or cleared by the [place]  FDA and  has been authorized for detection and/or diagnosis of SARS-CoV-2 by FDA under an Emergency Use Authorization (EUA). This EUA will remain  in effect (meaning this test can be used) for the duration of the COVID-19 declaration under Section 564(b)(1) of the Act, 21 U.S.C.section 360bbb-3(b)(1), unless the authorization is terminated  or revoked sooner.       Influenza A by PCR NEGATIVE NEGATIVE Final   Influenza B by PCR NEGATIVE NEGATIVE Final    Comment: (NOTE) The Xpert Xpress SARS-CoV-2/FLU/RSV plus assay is intended as an aid in the diagnosis of influenza from Nasopharyngeal swab specimens and should not be used as a sole basis for treatment. Nasal washings and aspirates are unacceptable for Xpert Xpress SARS-CoV-2/FLU/RSV testing.  Fact Sheet for Patients: bloggercourse.com  Fact Sheet for Healthcare Providers: seriousbroker.it  This test is not yet  approved or cleared by the [place]  FDA and has been authorized for detection and/or diagnosis of SARS-CoV-2 by FDA under an Emergency Use Authorization (EUA). This EUA will remain in effect (meaning this test can be used) for the duration of the COVID-19 declaration under Section 564(b)(1) of the Act, 21 U.S.C. section 360bbb-3(b)(1), unless the authorization is terminated or revoked.     Resp Syncytial Virus by PCR NEGATIVE NEGATIVE Final    Comment: (NOTE) Fact Sheet for Patients: bloggercourse.com  Fact Sheet for Healthcare Providers: seriousbroker.it  This test is not yet approved or cleared by the [place]  FDA and has been authorized for detection and/or diagnosis of SARS-CoV-2 by FDA under an Emergency Use Authorization (EUA). This EUA will remain in effect (meaning this test can be used) for  the duration of the COVID-19 declaration under Section 564(b)(1) of the Act, 21 U.S.C. section 360bbb-3(b)(1), unless the authorization is terminated or revoked.  Performed at [facility], [address], [place], [place] [address]     Labs: CBC: Recent Labs  Lab 08/06/24 1351 08/07/24 0414 08/08/24 0419  WBC 5.8 4.5 4.5  NEUTROABS 5.1  --   --   HGB 12.0 10.2* 9.7*  HCT 34.7* 29.6* 28.8*  MCV 106.1* 106.9* 107.5*  PLT 250 221 192   Basic Metabolic Panel: Recent Labs  Lab 08/06/24 1351 08/07/24 0414 08/08/24 0419  NA 136 141 141  K 3.9 3.5 3.7  CL 96* 104 106  CO2 29 21* 21*  GLUCOSE 113* 96 91  BUN 53* 45* 37*  CREATININE 3.07* 2.48* 1.92*  CALCIUM 10.1 9.2 8.7*  MG 2.7*  --   --    Liver Function Tests: Recent Labs  Lab 08/06/24 1351  AST 20  ALT 6  ALKPHOS 123  BILITOT 0.7  PROT 7.3  ALBUMIN 4.4   CBG: No results for input(s): GLUCAP in the last 168 hours.  Discharge time spent: greater than 30 minutes.  Signed: Alm Schneider, MD [facility] 08/08/2024 "

## 2024-08-08 NOTE — TOC Transition Note (Addendum)
 Transition of Care [facility]) - Discharge Note   Patient Details  Name: Maria Hardy MRN: [id number] Date of Birth: [birth year]  [facility] CM/SW Contact:  Lucie Lunger, LCSWA Phone Number: 08/08/2024, 2:14 PM   Clinical Narrative:    CSW spoke with pt and family at bedside to review offer from [facility], they accept offer at this time. Insurance shara has been approved. CSW spoke to Stacey Street with facility who confirms they can take pt today. CSW updated pt and family of plan for D/C. D/C clinicals sent to facility via HUB. Room and report sent to RN. Med necessity sent to floor. EMS called for transport. TOC signing off.   Final next level of care: Skilled Nursing Facility Barriers to Discharge: Barriers Resolved   Patient Goals and CMS Choice Patient states their goals for this hospitalization and ongoing recovery are:: go to SNF CMS Medicare.gov Compare Post Acute Care list provided to:: Patient Choice offered to / list presented to : Patient, Adult Children [place] ownership interest in [facility].provided to:: Patient    Discharge Placement                Patient to be transferred to facility by: EMS Name of family member notified: sons Patient and family notified of of transfer: 08/08/24  Discharge Plan and Services Additional resources added to the After Visit Summary for   In-house Referral: Clinical Social Work Discharge Planning Services: CM Consult Post Acute Care Choice: Skilled Nursing Facility                               Social Drivers of Health (SDOH) Interventions SDOH Screenings   Food Insecurity: No Food Insecurity (08/06/2024)  Housing: Low Risk (08/06/2024)  Transportation Needs: No Transportation Needs (08/06/2024)  Utilities: Not At Risk (08/06/2024)  Depression (PHQ2-9): Low Risk (05/17/2024)  Social Connections: Unknown (08/06/2024)  Tobacco Use: High Risk (08/06/2024)     Readmission Risk Interventions      No data to display

## 2024-08-09 DIAGNOSIS — M6281 Muscle weakness (generalized): Secondary | ICD-10-CM | POA: Insufficient documentation

## 2024-08-09 DIAGNOSIS — R269 Unspecified abnormalities of gait and mobility: Secondary | ICD-10-CM | POA: Insufficient documentation

## 2024-08-17 ENCOUNTER — Ambulatory Visit (HOSPITAL_COMMUNITY): Admission: RE | Admit: 2024-08-17 | Source: Ambulatory Visit

## 2024-08-22 ENCOUNTER — Other Ambulatory Visit (INDEPENDENT_AMBULATORY_CARE_PROVIDER_SITE_OTHER)

## 2024-08-22 ENCOUNTER — Ambulatory Visit: Admitting: Orthopedic Surgery

## 2024-08-22 ENCOUNTER — Encounter: Payer: Self-pay | Admitting: Orthopedic Surgery

## 2024-08-22 DIAGNOSIS — M25551 Pain in right hip: Secondary | ICD-10-CM | POA: Diagnosis not present

## 2024-08-22 DIAGNOSIS — M5417 Radiculopathy, lumbosacral region: Secondary | ICD-10-CM | POA: Diagnosis not present

## 2024-08-22 DIAGNOSIS — M160 Bilateral primary osteoarthritis of hip: Secondary | ICD-10-CM | POA: Diagnosis not present

## 2024-08-22 DIAGNOSIS — M545 Low back pain, unspecified: Secondary | ICD-10-CM

## 2024-08-22 DIAGNOSIS — M4150 Other secondary scoliosis, site unspecified: Secondary | ICD-10-CM

## 2024-08-22 MED ORDER — PREDNISONE 10 MG (48) PO TBPK: ORAL_TABLET | Freq: Every day | ORAL | 0 refills | Status: AC

## 2024-08-22 MED ORDER — ACETAMINOPHEN 500 MG PO TABS: 500.0000 mg | ORAL_TABLET | Freq: Four times a day (QID) | ORAL | 5 refills | Status: AC | PRN

## 2024-08-22 MED ORDER — TIZANIDINE HCL 4 MG PO TABS: 4.0000 mg | ORAL_TABLET | Freq: Three times a day (TID) | ORAL | 1 refills | Status: AC

## 2024-08-22 MED ORDER — GABAPENTIN 100 MG PO CAPS: 100.0000 mg | ORAL_CAPSULE | Freq: Three times a day (TID) | ORAL | 2 refills | Status: AC

## 2024-08-22 NOTE — Progress Notes (Signed)
" ° °  Patient: Maria Hardy           Date of Birth: 03/01/1941           MRN: [id number] Visit Date: 08/22/2024 Requested by: No referring provider defined for this encounter. PCP: Vick Lurie, FNP (Inactive)  Encounter Diagnoses  Name Primary?   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    Dispense:  30 tablet    Refill:  1   predniSONE  (STERAPRED UNI-PAK 48 TAB) 10 MG (48) TBPK tablet    Sig: Take by mouth daily. 10 mg ds 12 days as directed    Dispense:  48 tablet    Refill:  0   gabapentin  (NEURONTIN ) 100 MG capsule    Sig: Take 1 capsule (100 mg total) by mouth 3 (three) times daily.    Dispense:  90 capsule    Refill:  2   acetaminophen  (TYLENOL ) 500 MG tablet    Sig: Take 1 tablet (500 mg total) by mouth every 6 (six) hours as needed (pain.).    Dispense:  90 tablet    Refill:  5     Chief Complaint  Patient presents with   Back Pain    Worse right buttock down right leg    History:  84 year old female with stage IV breast cancer history of OA both hips grade 4 history of degenerative disc disease presents with lower back pain and radiculopathy down to the knee difficulty ambulating and fairly severe pain  Focused exam findings:  Tenderness is noted in her lower back on the right side right buttock lateral thigh up to the knee but not below she is having difficulty ambulating requiring a cane she has normal dorsiflexion plantarflexion of her right foot with no numbness  tingling or weakness there  DG Pelvis 1-2 Views Result Date: 08/22/2024 X-ray pelvis bilateral hip pain As previously noted on prior imaging studies the patient has grade 4 arthritis of both hips with severe disease hypertrophic osteophytes extensive disc space narrowing bilaterally Impression OA both hips   DG Lumbar Spine 2-3 Views Result Date: 08/22/2024 Lumbar spine images show a moderate lumbar scoliosis with multilevel degenerative disc disease and facet arthritis Impression lumbar spondylosis with scoliosis      "

## 2024-08-22 NOTE — Progress Notes (Signed)
" ° ° °  08/22/2024   Chief Complaint  Patient presents with   Back Pain    Worse right buttock down right leg    No diagnosis found.  What pharmacy do you use ? _[facility]__________  DOI/DOS/ Date:    Did you get better, worse or no change (Answer below)   Worse       "

## 2024-08-29 ENCOUNTER — Other Ambulatory Visit: Payer: Self-pay

## 2024-08-29 ENCOUNTER — Other Ambulatory Visit: Payer: Self-pay | Admitting: *Deleted

## 2024-08-29 ENCOUNTER — Other Ambulatory Visit: Payer: Self-pay | Admitting: Oncology

## 2024-08-29 DIAGNOSIS — Z170 Estrogen receptor positive status [ER+]: Secondary | ICD-10-CM

## 2024-08-29 MED ORDER — PALBOCICLIB 100 MG PO TABS
100.0000 mg | ORAL_TABLET | Freq: Every day | ORAL | 3 refills | Status: AC
  Filled 2024-08-29: qty 21, 21d supply, fill #0
  Filled 2024-08-31: qty 21, 28d supply, fill #0

## 2024-08-31 ENCOUNTER — Other Ambulatory Visit: Payer: Self-pay

## 2024-09-04 ENCOUNTER — Other Ambulatory Visit: Payer: Self-pay

## 2024-09-06 ENCOUNTER — Other Ambulatory Visit: Payer: Self-pay | Admitting: Pharmacy Technician

## 2024-09-06 ENCOUNTER — Other Ambulatory Visit: Payer: Self-pay

## 2024-09-06 NOTE — Progress Notes (Signed)
 Specialty Pharmacy Refill Coordination Note  Maria Hardy is a 84 y.o. female contacted today regarding refills of specialty medication(s) Palbociclib  (IBRANCE )   Patient requested Delivery   Delivery date: 09/13/24   Verified address: [address]  [place]   Medication will be filled on: 09/12/24

## 2024-09-12 ENCOUNTER — Ambulatory Visit: Admitting: Orthopedic Surgery

## 2024-09-12 ENCOUNTER — Other Ambulatory Visit: Payer: Self-pay

## 2024-09-14 ENCOUNTER — Inpatient Hospital Stay: Attending: Hematology

## 2024-09-21 ENCOUNTER — Ambulatory Visit: Admitting: Oncology

## 2024-09-21 ENCOUNTER — Inpatient Hospital Stay: Payer: Self-pay | Admitting: Oncology

## 2024-09-21 ENCOUNTER — Other Ambulatory Visit: Payer: Self-pay | Admitting: Orthopedic Surgery

## 2024-09-21 DIAGNOSIS — M4150 Other secondary scoliosis, site unspecified: Secondary | ICD-10-CM

## 2024-09-21 DIAGNOSIS — M5417 Radiculopathy, lumbosacral region: Secondary | ICD-10-CM

## 2024-09-21 DIAGNOSIS — M545 Low back pain, unspecified: Secondary | ICD-10-CM

## 2025-01-15 ENCOUNTER — Other Ambulatory Visit

## 2025-01-15 ENCOUNTER — Ambulatory Visit
